# Patient Record
Sex: Female | Born: 1995 | Race: White | Hispanic: No | Marital: Married | Smoking: Never smoker
Health system: Southern US, Community
[De-identification: ages and names within clinical notes are randomized; demographics above are authoritative.]

## PROBLEM LIST (undated history)

## (undated) DIAGNOSIS — G43909 Migraine, unspecified, not intractable, without status migrainosus: Secondary | ICD-10-CM

## (undated) DIAGNOSIS — R51 Headache: Secondary | ICD-10-CM

## (undated) DIAGNOSIS — R519 Headache, unspecified: Secondary | ICD-10-CM

## (undated) DIAGNOSIS — N200 Calculus of kidney: Secondary | ICD-10-CM

## (undated) HISTORY — DX: Headache, unspecified: R51.9

## (undated) HISTORY — DX: Headache: R51

## (undated) HISTORY — DX: Calculus of kidney: N20.0

## (undated) HISTORY — DX: Migraine, unspecified, not intractable, without status migrainosus: G43.909

---
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
## 2014-08-03 HISTORY — PX: MOUTH SURGERY: SHX715

## 2014-10-27 ENCOUNTER — Encounter (HOSPITAL_COMMUNITY): Payer: Self-pay | Admitting: *Deleted

## 2014-10-27 ENCOUNTER — Emergency Department (HOSPITAL_COMMUNITY)
Admission: EM | Admit: 2014-10-27 | Discharge: 2014-10-27 | Disposition: A | Discharge status: Home / Self Care | Payer: BLUE CROSS/BLUE SHIELD | Source: Home / Self Care | Attending: Family Medicine | Admitting: Family Medicine

## 2014-10-27 DIAGNOSIS — J020 Streptococcal pharyngitis: Secondary | ICD-10-CM

## 2014-10-27 LAB — POCT RAPID STREP A: Streptococcus, Group A Screen (Direct): POSITIVE — AB

## 2014-10-27 MED ORDER — AMOXICILLIN 500 MG PO CAPS
500.0000 mg | ORAL_CAPSULE | Freq: Two times a day (BID) | ORAL | Status: DC
Start: 2014-10-27 — End: 2014-11-09

## 2014-10-27 NOTE — ED Notes (Signed)
C/O severe sore throat with difficulty swallowing anything due to pain.  Finished amoxicillin last month for sinusitis - has "been sick x7 wks".  C/O feeling feverish.  Denies n/v.  Also c/o earache & HA.  Has been taking IBU and gargling salt water.

## 2014-10-27 NOTE — Discharge Instructions (Signed)
Strep Throat °Strep throat is an infection of the throat caused by a bacteria named Streptococcus pyogenes. Your health care provider may call the infection streptococcal "tonsillitis" or "pharyngitis" depending on whether there are signs of inflammation in the tonsils or back of the throat. Strep throat is most common in children aged 20-15 years during the cold months of the year, but it can occur in people of any age during any season. This infection is spread from person to person (contagious) through coughing, sneezing, or other close contact. °SIGNS AND SYMPTOMS  °· Fever or chills. °· Painful, swollen, red tonsils or throat. °· Pain or difficulty when swallowing. °· White or yellow spots on the tonsils or throat. °· Swollen, tender lymph nodes or "glands" of the neck or under the jaw. °· Red rash all over the body (rare). °DIAGNOSIS  °Many different infections can cause the same symptoms. A test must be done to confirm the diagnosis so the right treatment can be given. A "rapid strep test" can help your health care provider make the diagnosis in a few minutes. If this test is not available, a light swab of the infected area can be used for a throat culture test. If a throat culture test is done, results are usually available in a day or two. °TREATMENT  °Strep throat is treated with antibiotic medicine. °HOME CARE INSTRUCTIONS  °· Gargle with 1 tsp of salt in 1 cup of warm water, 3-4 times per day or as needed for comfort. °· Family members who also have a sore throat or fever should be tested for strep throat and treated with antibiotics if they have the strep infection. °· Make sure everyone in your household washes their hands well. °· Do not share food, drinking cups, or personal items that could cause the infection to spread to others. °· You may need to eat a soft food diet until your sore throat gets better. °· Drink enough water and fluids to keep your urine clear or pale yellow. This will help prevent  dehydration. °· Get plenty of rest. °· Stay home from school, day care, or work until you have been on antibiotics for 24 hours. °· Take medicines only as directed by your health care provider. °· Take your antibiotic medicine as directed by your health care provider. Finish it even if you start to feel better. °SEEK MEDICAL CARE IF:  °· The glands in your neck continue to enlarge. °· You develop a rash, cough, or earache. °· You cough up green, yellow-brown, or bloody sputum. °· You have pain or discomfort not controlled by medicines. °· Your problems seem to be getting worse rather than better. °· You have a fever. °SEEK IMMEDIATE MEDICAL CARE IF:  °· You develop any new symptoms such as vomiting, severe headache, stiff or painful neck, chest pain, shortness of breath, or trouble swallowing. °· You develop severe throat pain, drooling, or changes in your voice. °· You develop swelling of the neck, or the skin on the neck becomes red and tender. °· You develop signs of dehydration, such as fatigue, dry mouth, and decreased urination. °· You become increasingly sleepy, or you cannot wake up completely. °MAKE SURE YOU: °· Understand these instructions. °· Will watch your condition. °· Will get help right away if you are not doing well or get worse. °Document Released: 07/17/2000 Document Revised: 12/04/2013 Document Reviewed: 09/18/2010 °Exit[employer]® Patient Information ©2015 [employer]. This information is not intended to replace advice given to you by   your health care provider. Make sure you discuss any questions you have with your health care provider.   Plenty of fluids and rest. Motrin 800mg  every 8 hours as needed for pain.

## 2014-10-27 NOTE — ED Provider Notes (Signed)
CSN: [id number]     Arrival date & time 10/27/14  0901 History   First MD Initiated Contact with Patient 10/27/14 [phone]     Chief Complaint  Patient presents with  . Sore Throat   (Consider location/radiation/quality/duration/timing/severity/associated sxs/prior Treatment) HPI Comments: Patient presents with a "severe" sore throat x 3 days.Noted malaise and fevers. No cough or congestion. As noted in Nurse note; given Amox last month for sinus infection. Works at a daycare with exposures.   Patient is a 19 y.o. female presenting with pharyngitis. The history is provided by the patient.  Sore Throat    History reviewed. No pertinent past medical history. History reviewed. No pertinent past surgical history. No family history on file. History  Substance Use Topics  . Smoking status: Never Smoker   . Smokeless tobacco: Not on file  . Alcohol Use: No   OB History    No data available     Review of Systems  Constitutional: Positive for chills and fatigue. Negative for fever.  HENT: Positive for sore throat and trouble swallowing. Negative for postnasal drip and rhinorrhea.   Respiratory: Negative.   All other systems reviewed and are negative.   Allergies  Review of patient's allergies indicates no known allergies.  Home Medications   Prior to Admission medications   Medication Sig Start Date End Date Taking? Authorizing Provider  Acetaminophen 500 MG coapsule Take 1,500 mg by mouth. Prn HA   Yes Historical Provider, MD  amoxicillin (AMOXIL) 500 MG capsule Take 1 capsule (500 mg total) by mouth 2 (two) times daily. 10/27/14   Riki SheerMichelle G Mindee Robledo, PA-C   BP 141/87 mmHg  Pulse 116  Temp(Src) 98.4 F (36.9 C) (Oral)  Resp 12  SpO2 100%  LMP 09/28/2014 (Exact Date) Physical Exam  Constitutional: She is oriented to person, place, and time. She appears well-developed and well-nourished. No distress.  HENT:  Head: Normocephalic and atraumatic.  Mouth/Throat: Oropharyngeal  exudate present.  Moderately injected oropharynx with mild ulceration, and exudate. Tonsils +2 on the left  Neck: Normal range of motion.  Lymphadenopathy:    She has cervical adenopathy.  Neurological: She is alert and oriented to person, place, and time.  Skin: Skin is warm and dry. No rash noted. She is not diaphoretic. No erythema.  Psychiatric: Her behavior is normal.  Nursing note and vitals reviewed.   ED Course  Procedures (including critical care time) Labs Review Labs Reviewed  POCT RAPID STREP A ([facility] ONLY) - Abnormal; Notable for the following:    Streptococcus, Group A Screen (Direct) POSITIVE (*)    All other components within normal limits    Imaging Review No results found.   MDM   1. Strep pharyngitis    Treat with 10 days of Amox. Motrin as needed.    Riki SheerMichelle G Danilo Cappiello, PA-C 10/27/14 [phone]

## 2014-11-09 ENCOUNTER — Emergency Department (INDEPENDENT_AMBULATORY_CARE_PROVIDER_SITE_OTHER)
Admission: EM | Admit: 2014-11-09 | Discharge: 2014-11-09 | Disposition: A | Discharge status: Home / Self Care | Payer: BLUE CROSS/BLUE SHIELD | Source: Home / Self Care | Attending: Family Medicine | Admitting: Family Medicine

## 2014-11-09 ENCOUNTER — Encounter (HOSPITAL_COMMUNITY): Payer: Self-pay | Admitting: Emergency Medicine

## 2014-11-09 DIAGNOSIS — J0300 Acute streptococcal tonsillitis, unspecified: Secondary | ICD-10-CM

## 2014-11-09 LAB — POCT RAPID STREP A: STREPTOCOCCUS, GROUP A SCREEN (DIRECT): POSITIVE — AB

## 2014-11-09 MED ORDER — AMOXICILLIN 500 MG PO CAPS
500.0000 mg | ORAL_CAPSULE | Freq: Two times a day (BID) | ORAL | Status: DC
Start: 2014-11-09 — End: 2014-12-15

## 2014-11-09 NOTE — ED Provider Notes (Signed)
CSN: [id number]     Arrival date & time 11/09/14  1040 History   First MD Initiated Contact with Patient 11/09/14 1156     Chief Complaint  Patient presents with  . Sore Throat   (Consider location/radiation/quality/duration/timing/severity/associated sxs/prior Treatment) HPI         19 year old female presents complaining of possible strep throat. She had strep throat about 2-1/2 weeks ago. She treated this with amoxicillin and it got better. Her symptoms returned yesterday with sore throat, fever, chills, and tonsillar swelling. He says that no one told her she was supposed to throw away her toothbrush so she thinks she may have reinfected herself. No rash, cough, NVD.  History reviewed. No pertinent past medical history. History reviewed. No pertinent past surgical history. No family history on file. History  Substance Use Topics  . Smoking status: Never Smoker   . Smokeless tobacco: Not on file  . Alcohol Use: No   OB History    No data available     Review of Systems  Constitutional: Positive for fever and chills.  HENT: Positive for sore throat.   Respiratory: Negative for cough.   All other systems reviewed and are negative.   Allergies  Review of patient's allergies indicates no known allergies.  Home Medications   Prior to Admission medications   Medication Sig Start Date End Date Taking? Authorizing Provider  Acetaminophen 500 MG coapsule Take 1,500 mg by mouth. Prn HA    Historical Provider, MD  amoxicillin (AMOXIL) 500 MG capsule Take 1 capsule (500 mg total) by mouth 2 (two) times daily. 11/09/14   Adrian BlackwaterZachary H Parlee Amescua, PA-C   BP 124/79 mmHg  Pulse 114  Temp(Src) 99.5 F (37.5 C) (Oral)  Resp 14  SpO2 99%  LMP 09/28/2014 (Exact Date) Physical Exam  Constitutional: She is oriented to person, place, and time. Vital signs are normal. She appears well-developed and well-nourished. No distress.  HENT:  Head: Normocephalic and atraumatic.  Nose: Nose normal.   Mouth/Throat: Oropharyngeal exudate and posterior oropharyngeal erythema present.  3+ tonsillar enlargement symmetric bilaterally  Neck: Normal range of motion. Neck supple.  Pulmonary/Chest: Effort normal. No respiratory distress.  Lymphadenopathy:    She has cervical adenopathy (tonsillar and posterior cervical ).  Neurological: She is alert and oriented to person, place, and time. She has normal strength. Coordination normal.  Skin: Skin is warm and dry. No rash noted. She is not diaphoretic.  Psychiatric: She has a normal mood and affect. Judgment normal.  Nursing note and vitals reviewed.   ED Course  Procedures (including critical care time) Labs Review Labs Reviewed  POCT RAPID STREP A ([facility] ONLY) - Abnormal; Notable for the following:    Streptococcus, Group A Screen (Direct) POSITIVE (*)    All other components within normal limits    Imaging Review No results found.   MDM   1. Strep tonsillitis    Rapid strep is positive. Treat with amoxicillin again. Discussed strategies to prevent reinfection   Meds ordered this encounter  Medications  . amoxicillin (AMOXIL) 500 MG capsule    Sig: Take 1 capsule (500 mg total) by mouth 2 (two) times daily.    Dispense:  14 capsule    Refill:  0       Graylon GoodZachary H Kanesha Cadle, PA-C 11/09/14 1231

## 2014-11-09 NOTE — ED Notes (Signed)
C/o persistent sore throat onset 1 month [place] e[place] on 03/26  And was given Amox 500mg  for strep Taking ibup and tyle w/no relief Alert, no signs of acute distress.

## 2014-11-09 NOTE — Discharge Instructions (Signed)
Strep Throat °Strep throat is an infection of the throat caused by a bacteria named Streptococcus pyogenes. Your health care provider may call the infection streptococcal "tonsillitis" or "pharyngitis" depending on whether there are signs of inflammation in the tonsils or back of the throat. Strep throat is most common in children aged 20-15 years during the cold months of the year, but it can occur in people of any age during any season. This infection is spread from person to person (contagious) through coughing, sneezing, or other close contact. °SIGNS AND SYMPTOMS  °· Fever or chills. °· Painful, swollen, red tonsils or throat. °· Pain or difficulty when swallowing. °· White or yellow spots on the tonsils or throat. °· Swollen, tender lymph nodes or "glands" of the neck or under the jaw. °· Red rash all over the body (rare). °DIAGNOSIS  °Many different infections can cause the same symptoms. A test must be done to confirm the diagnosis so the right treatment can be given. A "rapid strep test" can help your health care provider make the diagnosis in a few minutes. If this test is not available, a light swab of the infected area can be used for a throat culture test. If a throat culture test is done, results are usually available in a day or two. °TREATMENT  °Strep throat is treated with antibiotic medicine. °HOME CARE INSTRUCTIONS  °· Gargle with 1 tsp of salt in 1 cup of warm water, 3-4 times per day or as needed for comfort. °· Family members who also have a sore throat or fever should be tested for strep throat and treated with antibiotics if they have the strep infection. °· Make sure everyone in your household washes their hands well. °· Do not share food, drinking cups, or personal items that could cause the infection to spread to others. °· You may need to eat a soft food diet until your sore throat gets better. °· Drink enough water and fluids to keep your urine clear or pale yellow. This will help prevent  dehydration. °· Get plenty of rest. °· Stay home from school, day care, or work until you have been on antibiotics for 24 hours. °· Take medicines only as directed by your health care provider. °· Take your antibiotic medicine as directed by your health care provider. Finish it even if you start to feel better. °SEEK MEDICAL CARE IF:  °· The glands in your neck continue to enlarge. °· You develop a rash, cough, or earache. °· You cough up green, yellow-brown, or bloody sputum. °· You have pain or discomfort not controlled by medicines. °· Your problems seem to be getting worse rather than better. °· You have a fever. °SEEK IMMEDIATE MEDICAL CARE IF:  °· You develop any new symptoms such as vomiting, severe headache, stiff or painful neck, chest pain, shortness of breath, or trouble swallowing. °· You develop severe throat pain, drooling, or changes in your voice. °· You develop swelling of the neck, or the skin on the neck becomes red and tender. °· You develop signs of dehydration, such as fatigue, dry mouth, and decreased urination. °· You become increasingly sleepy, or you cannot wake up completely. °MAKE SURE YOU: °· Understand these instructions. °· Will watch your condition. °· Will get help right away if you are not doing well or get worse. °Document Released: 07/17/2000 Document Revised: 12/04/2013 Document Reviewed: 09/18/2010 °[employer]® Patient Information ©2015 [employer]. This information is not intended to replace advice given to you by   your health care provider. Make sure you discuss any questions you have with your health care provider. °Tonsillitis °Tonsillitis is an infection of the throat that causes the tonsils to become red, tender, and swollen. Tonsils are collections of lymphoid tissue at the back of the throat. Each tonsil has crevices (crypts). Tonsils help fight nose and throat infections and keep infection from spreading to other parts of the body for the first 18 months of life.    °CAUSES °Sudden (acute) tonsillitis is usually caused by infection with streptococcal bacteria. Long-lasting (chronic) tonsillitis occurs when the crypts of the tonsils become filled with pieces of food and bacteria, which makes it easy for the tonsils to become repeatedly infected. °SYMPTOMS  °Symptoms of tonsillitis include: °· A sore throat, with possible difficulty swallowing. °· White patches on the tonsils. °· Fever. °· Tiredness. °· New episodes of snoring during sleep, when you did not snore before. °· Small, foul-smelling, yellowish-white pieces of material (tonsilloliths) that you occasionally cough up or spit out. The tonsilloliths can also cause you to have bad breath. °DIAGNOSIS °Tonsillitis can be diagnosed through a physical exam. Diagnosis can be confirmed with the results of lab tests, including a throat culture. °TREATMENT  °The goals of tonsillitis treatment include the reduction of the severity and duration of symptoms and prevention of associated conditions. Symptoms of tonsillitis can be improved with the use of steroids to reduce the swelling. Tonsillitis caused by bacteria can be treated with antibiotic medicines. Usually, treatment with antibiotic medicines is started before the cause of the tonsillitis is known. However, if it is determined that the cause is not bacterial, antibiotic medicines will not treat the tonsillitis. If attacks of tonsillitis are severe and frequent, your health care provider may recommend surgery to remove the tonsils (tonsillectomy). °HOME CARE INSTRUCTIONS  °· Rest as much as possible and get plenty of sleep. °· Drink plenty of fluids. While the throat is very sore, eat soft foods or liquids, such as sherbet, soups, or instant breakfast drinks. °· Eat frozen ice pops. °· Gargle with a warm or cold liquid to help soothe the throat. Mix 1/4 teaspoon of salt and 1/4 teaspoon of baking soda in 8 oz of water. °SEEK MEDICAL CARE IF:  °· Large, tender lumps develop in  your neck. °· A rash develops. °· A green, yellow-brown, or bloody substance is coughed up. °· You are unable to swallow liquids or food for 24 hours. °· You notice that only one of the tonsils is swollen. °SEEK IMMEDIATE MEDICAL CARE IF:  °· You develop any new symptoms such as vomiting, severe headache, stiff neck, chest pain, or trouble breathing or swallowing. °· You have severe throat pain along with drooling or voice changes. °· You have severe pain, unrelieved with recommended medications. °· You are unable to fully open the mouth. °· You develop redness, swelling, or severe pain anywhere in the neck. °· You have a fever. °MAKE SURE YOU:  °· Understand these instructions. °· Will watch your condition. °· Will get help right away if you are not doing well or get worse. °Document Released: 04/29/2005 Document Revised: 12/04/2013 Document Reviewed: 01/06/2013 °[employer]® Patient Information ©2015 [employer]. This information is not intended to replace advice given to you by your health care provider. Make sure you discuss any questions you have with your health care provider. ° °

## 2014-12-15 ENCOUNTER — Emergency Department (HOSPITAL_COMMUNITY)
Admission: EM | Admit: 2014-12-15 | Discharge: 2014-12-15 | Disposition: A | Discharge status: Home / Self Care | Payer: BLUE CROSS/BLUE SHIELD | Source: Home / Self Care | Attending: Emergency Medicine | Admitting: Emergency Medicine

## 2014-12-15 DIAGNOSIS — J039 Acute tonsillitis, unspecified: Secondary | ICD-10-CM | POA: Diagnosis not present

## 2014-12-15 LAB — POCT RAPID STREP A: STREPTOCOCCUS, GROUP A SCREEN (DIRECT): NEGATIVE

## 2014-12-15 MED ORDER — AMOXICILLIN 500 MG PO CAPS: 500.0000 mg | ORAL_CAPSULE | Freq: Two times a day (BID) | ORAL | Status: DC

## 2014-12-15 NOTE — Discharge Instructions (Signed)
Your rapid strep test is negative, but it looks like you have a bacterial tonsillitis. Take amoxicillin twice a day for 10 days. [facility].

## 2014-12-15 NOTE — ED Provider Notes (Signed)
CSN: [id number]     Arrival date & time 12/15/14  [id number] History   First MD Initiated Contact with Patient 12/15/14 1008     Chief Complaint  Patient presents with  . Sore Throat   (Consider location/radiation/quality/duration/timing/severity/associated sxs/prior Treatment) HPI  She is a 19 year old woman here for evaluation of sore throat. Symptoms started 2 days ago with sore throat, white spots on tonsils, headache, subjective fever. She reports a very mild runny nose. No cough. She has been treated for strep throat twice in the last 6 weeks.  No past medical history on file. No past surgical history on file. No family history on file. History  Substance Use Topics  . Smoking status: Never Smoker   . Smokeless tobacco: Not on file  . Alcohol Use: No   OB History    No data available     Review of Systems As in history of present illness Allergies  Review of patient's allergies indicates no known allergies.  Home Medications   Prior to Admission medications   Medication Sig Start Date End Date Taking? Authorizing Provider  Acetaminophen 500 MG coapsule Take 1,500 mg by mouth. Prn HA    Historical Provider, MD  amoxicillin (AMOXIL) 500 MG capsule Take 1 capsule (500 mg total) by mouth 2 (two) times daily. 12/15/14   Charm RingsErin J Philo Kurtz, MD   BP 123/81 mmHg  Pulse 115  Temp(Src) 99.8 F (37.7 C) (Oral)  Resp 16  SpO2 99%  LMP 12/11/2014 (Exact Date) Physical Exam  Constitutional: She is oriented to person, place, and time. She appears well-developed and well-nourished. No distress.  HENT:  Nose: Nose normal.  Mouth/Throat: Oropharyngeal exudate present.  Tonsils are swollen and erythematous  Eyes: Conjunctivae are normal.  Neck: Neck supple.  Cardiovascular: Normal rate, regular rhythm and normal heart sounds.   No murmur heard. Pulmonary/Chest: Effort normal and breath sounds normal. No respiratory distress. She has no wheezes. She has no rales.  Lymphadenopathy:    She  has cervical adenopathy (submandibular).  Neurological: She is alert and oriented to person, place, and time.    ED Course  Procedures (including critical care time) Labs Review Labs Reviewed  POCT RAPID STREP A ([facility] ONLY)    Imaging Review No results found.   MDM   1. Tonsillitis    Rapid strep is negative. I'm concerned for other bacterial tonsillitis. We'll treat with amoxicillin for 10 days. Referral placed for ENT.    Charm RingsErin J Haeley Fordham, MD 12/15/14 [phone]

## 2014-12-15 NOTE — ED Notes (Signed)
Sore throat  X 2 days. Pt has been exposed to strep. Delice LeschAU,RMA

## 2014-12-18 LAB — CULTURE, GROUP A STREP

## 2014-12-19 ENCOUNTER — Telehealth (HOSPITAL_COMMUNITY): Payer: Self-pay | Admitting: *Deleted

## 2014-12-19 NOTE — ED Notes (Addendum)
Throat culture: Strep beta hemolytic not group A.  Pt. adequately treated with Amoxicillin. I called pt. and she said the nurse, came back to the room and told her she did not need the Rx. I told her I could see that [place] [facility] [place]. I apologized for the confusion.  She said she is better.  I told her this type of strep needs treated, if she is not getting better- per previous discussions with the doctor's. Since she is better, I told her she does not need the Rx. Pt. had no questions. Will let Dr. Piedad ClimesHonig know. Vassie MoselleYork, Jamaul Heist M 12/19/2014 Dr. Piedad ClimesHonig wrote back that she did intend for pt. to get the medication and it must have been a miscommunication.  No further action needed now since pt. said she is better. 12/20/2014

## 2015-06-04 ENCOUNTER — Emergency Department (HOSPITAL_COMMUNITY)
Admission: EM | Admit: 2015-06-04 | Discharge: 2015-06-04 | Disposition: A | Discharge status: Home / Self Care | Payer: BLUE CROSS/BLUE SHIELD | Source: Home / Self Care | Attending: Emergency Medicine | Admitting: Emergency Medicine

## 2015-06-04 ENCOUNTER — Encounter (HOSPITAL_COMMUNITY): Payer: Self-pay | Admitting: Emergency Medicine

## 2015-06-04 DIAGNOSIS — J9801 Acute bronchospasm: Secondary | ICD-10-CM

## 2015-06-04 MED ORDER — PREDNISONE 20 MG PO TABS
ORAL_TABLET | ORAL | Status: DC
Start: 2015-06-04 — End: 2015-06-11

## 2015-06-04 MED ORDER — IPRATROPIUM-ALBUTEROL 0.5-2.5 (3) MG/3ML IN SOLN
RESPIRATORY_TRACT | Status: AC
  Filled 2015-06-04: qty 3

## 2015-06-04 MED ORDER — ALBUTEROL SULFATE HFA 108 (90 BASE) MCG/ACT IN AERS: 2.0000 | INHALATION_SPRAY | RESPIRATORY_TRACT | Status: DC | PRN

## 2015-06-04 MED ORDER — IPRATROPIUM-ALBUTEROL 0.5-2.5 (3) MG/3ML IN SOLN
3.0000 mL | Freq: Once | RESPIRATORY_TRACT | Status: AC
  Administered 2015-06-04: 3 mL via RESPIRATORY_TRACT

## 2015-06-04 NOTE — ED Provider Notes (Signed)
CSN: [id number]     Arrival date & time 06/04/15  1426 History   First MD Initiated Contact with Patient 06/04/15 1452     Chief Complaint  Patient presents with  . Cough  . Wheezing   (Consider location/radiation/quality/duration/timing/severity/associated sxs/prior Treatment) HPI Comments: 19 year old female complaining of a cough and wheeze for 4-5 days. She has mild upper rest were congestion. She denies earache or sore throat except when she coughs. Denies fever. She does not have a history of asthma or smoking.  Patient is a 19 y.o. female presenting with cough and wheezing.  Cough Associated symptoms: sore throat and wheezing   Associated symptoms: no chills, no fever, no rash and no rhinorrhea   Wheezing Associated symptoms: cough and sore throat   Associated symptoms: no fatigue, no fever, no rash and no rhinorrhea     History reviewed. No pertinent past medical history. History reviewed. No pertinent past surgical history. History reviewed. No pertinent family history. Social History  Substance Use Topics  . Smoking status: Never Smoker   . Smokeless tobacco: None  . Alcohol Use: No   OB History    No data available     Review of Systems  Constitutional: Positive for activity change. Negative for fever, chills, appetite change and fatigue.  HENT: Positive for congestion and sore throat. Negative for facial swelling, postnasal drip, rhinorrhea and trouble swallowing.   Eyes: Negative.   Respiratory: Positive for cough and wheezing.   Cardiovascular: Negative.   Musculoskeletal: Negative for neck pain and neck stiffness.  Skin: Negative for pallor and rash.  Neurological: Negative.     Allergies  Review of patient's allergies indicates no known allergies.  Home Medications   Prior to Admission medications   Medication Sig Start Date End Date Taking? Authorizing Provider  Acetaminophen 500 MG coapsule Take 1,500 mg by mouth. Prn HA   Yes Historical Provider,  MD  albuterol (PROVENTIL HFA;VENTOLIN HFA) 108 (90 BASE) MCG/ACT inhaler Inhale 2 puffs into the lungs every 4 (four) hours as needed for wheezing or shortness of breath. 06/04/15   Hayden Rasmussen, NP  predniSONE (DELTASONE) 20 MG tablet Take 3 tabs po on first day, 2 tabs second day, 2 tabs third day, 1 tab fourth day, 1 tab 5th day. Take with food. 06/04/15   Hayden Rasmussen, NP   Meds Ordered and Administered this Visit   Medications  ipratropium-albuterol (DUONEB) 0.5-2.5 (3) MG/3ML nebulizer solution 3 mL (3 mLs Nebulization Given 06/04/15 1535)    BP 125/77 mmHg  Pulse 122  Temp(Src) 99.8 F (37.7 C) (Oral)  Resp 20  SpO2 98%  LMP 05/21/2015 (Exact Date) No data found.   Physical Exam  Constitutional: She is oriented to person, place, and time. She appears well-developed and well-nourished. No distress.  HENT:  Mouth/Throat: No oropharyngeal exudate.  Bilateral TMs are normal Oropharynx with mild erythema and much cobblestoning. Mild clear PND.  Eyes: Conjunctivae and EOM are normal.  Neck: Normal range of motion. Neck supple.  Cardiovascular: Normal rate, regular rhythm and normal heart sounds.   Pulmonary/Chest: Effort normal. No respiratory distress. She has wheezes. She has no rales.  Bilateral wheezing with forced expiration. Prolonged expiratory phase.  Musculoskeletal: Normal range of motion. She exhibits no edema.  Lymphadenopathy:    She has no cervical adenopathy.  Neurological: She is alert and oriented to person, place, and time.  Skin: Skin is warm and dry. No rash noted.  Psychiatric: She has a normal mood and affect.  Nursing note and vitals reviewed.   ED Course  Procedures (including critical care time)  Labs Review Labs Reviewed - No data to display  Imaging Review No results found.   Visual Acuity Review  Right Eye Distance:   Left Eye Distance:   Bilateral Distance:    Right Eye Near:   Left Eye Near:    Bilateral Near:         MDM   1.  Cough due to bronchospasm    Patient may also have a URI that precipitated her bronchospasm. Albuterol HFA 2 puffs every 4-6 hours as needed for cough and wheeze Prednisone taper dose Recommend taking antihistamines such as Allegra, Claritin or Zyrtec.     Hayden Rasmussenavid Lora Glomski, NP 06/04/15 [phone]

## 2015-06-04 NOTE — Discharge Instructions (Signed)
Bronchospasm, Adult Albuterol HFA 2 puffs every 4-6 hours as needed for cough and wheeze Prednisone taper dose Recommend taking antihistamines such as Allegra, Claritin or Zyrtec. A bronchospasm is a spasm or tightening of the airways going into the lungs. During a bronchospasm breathing becomes more difficult because the airways get smaller. When this happens there can be coughing, a whistling sound when breathing (wheezing), and difficulty breathing. Bronchospasm is often associated with asthma, but not all patients who experience a bronchospasm have asthma. CAUSES  A bronchospasm is caused by inflammation or irritation of the airways. The inflammation or irritation may be triggered by:   Allergies (such as to animals, pollen, food, or mold). Allergens that cause bronchospasm may cause wheezing immediately after exposure or many hours later.   Infection. Viral infections are believed to be the most common cause of bronchospasm.   Exercise.   Irritants (such as pollution, cigarette smoke, strong odors, aerosol sprays, and paint fumes).   Weather changes. Winds increase molds and pollens in the air. Rain refreshes the air by washing irritants out. Cold air may cause inflammation.   Stress and emotional upset.  SIGNS AND SYMPTOMS   Wheezing.   Excessive nighttime coughing.   Frequent or severe coughing with a simple cold.   Chest tightness.   Shortness of breath.  DIAGNOSIS  Bronchospasm is usually diagnosed through a history and physical exam. Tests, such as chest X-rays, are sometimes done to look for other conditions. TREATMENT   Inhaled medicines can be given to open up your airways and help you breathe. The medicines can be given using either an inhaler or a nebulizer machine.  Corticosteroid medicines may be given for severe bronchospasm, usually when it is associated with asthma. HOME CARE INSTRUCTIONS   Always have a plan prepared for seeking medical care.  Know when to call your health care provider and local emergency services (911 in the [place] Know where you can access local emergency care.  Only take medicines as directed by your health care provider.  If you were prescribed an inhaler or nebulizer machine, ask your health care provider to explain how to use it correctly. Always use a spacer with your inhaler if you were given one.  It is necessary to remain calm during an attack. Try to relax and breathe more slowly.  Control your home environment in the following ways:   Change your heating and air conditioning filter at least once a month.   Limit your use of fireplaces and wood stoves.  Do not smoke and do not allow smoking in your home.   Avoid exposure to perfumes and fragrances.   Get rid of pests (such as roaches and mice) and their droppings.   Throw away plants if you see mold on them.   Keep your house clean and dust free.   Replace carpet with wood, tile, or vinyl flooring. Carpet can trap dander and dust.   Use allergy-proof pillows, mattress covers, and box spring covers.   Wash bed sheets and blankets every week in hot water and dry them in a dryer.   Use blankets that are made of polyester or cotton.   Wash hands frequently. SEEK MEDICAL CARE IF:   You have muscle aches.   You have chest pain.   The sputum changes from clear or white to yellow, green, gray, or bloody.   The sputum you cough up gets thicker.   There are problems that may be related to the medicine you  are given, such as a rash, itching, swelling, or trouble breathing.  SEEK IMMEDIATE MEDICAL CARE IF:   You have worsening wheezing and coughing even after taking your prescribed medicines.   You have increased difficulty breathing.   You develop severe chest pain. MAKE SURE YOU:   Understand these instructions.  Will watch your condition.  Will get help right away if you are not doing well or get worse.     This information is not intended to replace advice given to you by your health care provider. Make sure you discuss any questions you have with your health care provider.   Document Released: 07/23/2003 Document Revised: 08/10/2014 Document Reviewed: 01/09/2013 Elsevier Interactive Patient Education [employer]

## 2015-06-04 NOTE — ED Notes (Signed)
Pt has been suffering from a cough and wheezing and some nasal congestion for 5 days.  She denies any fever, but states her throat hurts from all the coughing.  She has had a few doses of Robitussin, and some Tylenol and Ibuprofen with little relief.

## 2015-06-11 ENCOUNTER — Encounter: Payer: Self-pay | Admitting: Primary Care

## 2015-06-11 ENCOUNTER — Ambulatory Visit (INDEPENDENT_AMBULATORY_CARE_PROVIDER_SITE_OTHER): Payer: BLUE CROSS/BLUE SHIELD | Admitting: Primary Care

## 2015-06-11 VITALS — BP 118/64 | HR 96 | Temp 98.2°F | Ht 69.0 in | Wt 273.1 lb

## 2015-06-11 DIAGNOSIS — J209 Acute bronchitis, unspecified: Secondary | ICD-10-CM | POA: Diagnosis not present

## 2015-06-11 DIAGNOSIS — Z7189 Other specified counseling: Secondary | ICD-10-CM

## 2015-06-11 DIAGNOSIS — Z7689 Persons encountering health services in other specified circumstances: Secondary | ICD-10-CM

## 2015-06-11 NOTE — Progress Notes (Signed)
Pre visit review using our clinic review tool, if applicable. No additional management support is needed unless otherwise documented below in the visit note. 

## 2015-06-11 NOTE — Patient Instructions (Signed)
Start Zpak antibiotics. Take 2 tablets by mouth today, then 1 tablet by mouth daily for 4 additional days.  You may take the Benzonatate capsules three times daily as needed for cough.  Please call me if no improvement in 3-4 days.  It was a pleasure to meet you today! Please don't hesitate to call me with any questions. Welcome to [facility]!

## 2015-06-11 NOTE — Progress Notes (Signed)
Subjective:    Patient ID: Autumn Stanley, female    DOB: 1996-02-01, 19 y.o.   MRN: [id number]  HPI  Autumn Stanley is a 19 year old female who presents today to establish care and discuss the problems mentioned below. Will obtain old records.  1) Bronchospasm: Evaluated at Urgent Care 1 week ago for wheezing and cough. She was told she had bronchospasm and was provided with a nebulized breathing treatment, RX for prednisone and RX for albuterol inhaler. She continues to cough, feel fatigued. Her cough has been present for a total of 2 weeks. She's overall not feeling any improved.   2) Frequent Headaches: Chronic since middle school. She will get headaches 3-4 times weekly. She will take tylenol or ibuprofen with improvement. She's never been on preventative treatment for her headaches. Reports history of migraines.  Review of Systems  Constitutional: Positive for chills and fatigue. Negative for unexpected weight change.  HENT: Positive for congestion. Negative for rhinorrhea.   Respiratory: Positive for cough. Negative for shortness of breath and wheezing.   Cardiovascular: Negative for chest pain.  Gastrointestinal: Negative for constipation.  Genitourinary: Negative for difficulty urinating.  Musculoskeletal: Negative for myalgias and arthralgias.  Allergic/Immunologic: Positive for environmental allergies.  Neurological: Positive for headaches. Negative for dizziness and numbness.  Psychiatric/Behavioral:       Denies concerns for anxiety or depression       Past Medical History  Diagnosis Date  . Kidney stone   . Migraine     Social History   Social History  . Marital Status: Single    Spouse Name: N/A  . Number of Children: N/A  . Years of Education: N/A   Occupational History  . Not on file.   Social History Main Topics  . Smoking status: Never Smoker   . Smokeless tobacco: Not on file  . Alcohol Use: No  . Drug Use: No  . Sexual Activity: Not on file   Comment: occasionally sexually active   Other Topics Concern  . Not on file   Social History Narrative    Past Surgical History  Procedure Laterality Date  . Mouth surgery  2016    Family History  Problem Relation Age of Onset  . Arthritis Mother   . Breast cancer Mother   . Arthritis Maternal Grandmother   . Heart disease Maternal Grandmother   . Hyperlipidemia Maternal Grandmother   . Prostate cancer Maternal Grandfather   . Hyperlipidemia Maternal Grandfather   . Hyperlipidemia Paternal Grandmother   . Hyperlipidemia Paternal Grandfather     No Known Allergies  Current Outpatient Prescriptions on File Prior to Visit  Medication Sig Dispense Refill  . Acetaminophen 500 MG coapsule Take 1,500 mg by mouth. Prn HA    . albuterol (PROVENTIL HFA;VENTOLIN HFA) 108 (90 BASE) MCG/ACT inhaler Inhale 2 puffs into the lungs every 4 (four) hours as needed for wheezing or shortness of breath. (Patient not taking: Reported on 06/11/2015) 1 Inhaler 0   No current facility-administered medications on file prior to visit.    BP 118/64 mmHg  Pulse 96  Temp(Src) 98.2 F (36.8 C) (Oral)  Ht  (1.753 m)  Wt 273 lb 1.9 oz (123.886 kg)  BMI 40.31 kg/m2  SpO2 92%  LMP 05/21/2015 (Exact Date)    Objective:   Physical Exam  Constitutional: She is oriented to person, place, and time. She appears well-nourished.  HENT:  Right Ear: Tympanic membrane and ear canal normal.  Left Ear: Tympanic  membrane and ear canal normal.  Nose: Right sinus exhibits no maxillary sinus tenderness and no frontal sinus tenderness. Left sinus exhibits no maxillary sinus tenderness and no frontal sinus tenderness.  Mouth/Throat: Oropharynx is clear and moist.  Eyes: Conjunctivae are normal. Pupils are equal, round, and reactive to light.  Neck: Neck supple.  Cardiovascular: Normal rate and regular rhythm.   Pulmonary/Chest: Effort normal. She has rhonchi in the right upper field and the left upper field.   Neurological: She is alert and oriented to person, place, and time.  Skin: Skin is warm and dry.  Psychiatric: She has a normal mood and affect.          Assessment & Plan:  Bronchitis:  Treated at UC 1 week ago, provided with prednisone taper and albuterol. She continues to cough, feels fatigued and ill. No wheezing. Lungs with rhonchi to bilateral upper lobes, appears ill. Treat with Zpak antibiotics and Tessalon Pearls. Fluids, rest. Return precautions provided.

## 2015-06-12 ENCOUNTER — Telehealth: Payer: Self-pay | Admitting: *Deleted

## 2015-06-12 NOTE — Telephone Encounter (Signed)
Already sent the faxed this morning with the correction. Also just called the pharmacist to confirm.

## 2015-06-12 NOTE — Telephone Encounter (Signed)
Please apologize for me, our computers went down. The RX should be for Tessalon Pearls 200 mg capsules. 1 capsule by mouth three times daily as needed for cough. #21, no refills

## 2015-06-12 NOTE — Telephone Encounter (Signed)
[facility] at [facility]ic[facility] stating that they received a script for [employer] and they need to know if it should be for 100 mg or 200 mg. Please call her back to verify this.

## 2015-06-14 ENCOUNTER — Telehealth: Payer: Self-pay

## 2015-06-14 MED ORDER — MOXIFLOXACIN HCL 400 MG PO TABS: 400.0000 mg | ORAL_TABLET | Freq: Every day | ORAL | Status: DC

## 2015-06-14 NOTE — Telephone Encounter (Signed)
Pt left v/m; pt was seen 06/11/15 to establish care and given abx for cough; pt said she will finish abx today and pt is no better. Pt request different abx to [facility]. Pt request cb. Jae DireKate out of office and sending note to Dr Ermalene SearingBedsole.

## 2015-06-14 NOTE — Telephone Encounter (Signed)
Given rhonchi on lung exam and ill appearance at last OV. Will  Broaden antibiotics to avelox. If not improving by Monday need follow up appt for further eval. Go to ER for shortness of breath.

## 2015-06-14 NOTE — Telephone Encounter (Signed)
Sally-Ann notified as instructed by telephone.  

## 2015-07-19 ENCOUNTER — Ambulatory Visit (INDEPENDENT_AMBULATORY_CARE_PROVIDER_SITE_OTHER): Payer: BLUE CROSS/BLUE SHIELD | Admitting: Primary Care

## 2015-07-19 ENCOUNTER — Encounter: Payer: Self-pay | Admitting: Primary Care

## 2015-07-19 VITALS — BP 116/82 | HR 105 | Temp 97.8°F | Ht 69.0 in | Wt 273.0 lb

## 2015-07-19 DIAGNOSIS — Z30011 Encounter for initial prescription of contraceptive pills: Secondary | ICD-10-CM | POA: Diagnosis not present

## 2015-07-19 DIAGNOSIS — R519 Headache, unspecified: Secondary | ICD-10-CM

## 2015-07-19 DIAGNOSIS — R51 Headache: Secondary | ICD-10-CM

## 2015-07-19 LAB — POCT URINE PREGNANCY: PREG TEST UR: NEGATIVE

## 2015-07-19 MED ORDER — NORGESTIMATE-ETH ESTRADIOL 0.25-35 MG-MCG PO TABS: 1.0000 | ORAL_TABLET | Freq: Every day | ORAL | Status: DC

## 2015-07-19 MED ORDER — TOPIRAMATE 25 MG PO TABS: 25.0000 mg | ORAL_TABLET | Freq: Every day | ORAL | Status: DC

## 2015-07-19 NOTE — Progress Notes (Signed)
Pre visit review using our clinic review tool, if applicable. No additional management support is needed unless otherwise documented below in the visit note. 

## 2015-07-19 NOTE — Assessment & Plan Note (Signed)
History of since middle school, never on preventative treatment. Headaches 5-6 times weekly with migraines 2-3 times monthly. No improvement with OTC's. Exam unremarkable. Start low doseTopamax HS for prevention. Also starting OCP's that may help as well. Follow up in 6 weeks for re-evaluation.

## 2015-07-19 NOTE — Progress Notes (Signed)
Subjective:    Patient ID: Autumn Stanley, female    DOB: [birth year], 19 y.o.   MRN: [id number]  HPI  [name] is a 19 year old female who presents today to discuss multiple issues.  1) Headaches: She was evaluated as a new patient several months ago and mentioned frequent headaches. Her headaches have been present since Middle school. She was once getting headaches 3-4 times weekly, but over the last 3 weeks they increased to 5-6 times weekly. She will wake up with her headaches which will not dissipate for several days. She will get migraines 2-3 times month with photophobia and nausea. She takes tylenol, ibuprofen, Excedrin migraine without improvement.   2) Contraception: She is interested in starting birth control pills. She's never been managed on contraceptives in the past. Her LMP was November 24th. She is sexually active. Denies vaginal and urinary symptoms.  Review of Systems  Eyes: Positive for photophobia.  Cardiovascular: Negative for chest pain and palpitations.  Gastrointestinal: Positive for nausea.  Genitourinary: Negative for dysuria and vaginal discharge.  Neurological: Positive for headaches. Negative for dizziness.       Past Medical History  Diagnosis Date  . Kidney stone   . Migraine   . Frequent headaches     Social History   Social History  . Marital Status: Single    Spouse Name: N/A  . Number of Children: N/A  . Years of Education: N/A   Occupational History  . daycare worker    Social History Main Topics  . Smoking status: Never Smoker   . Smokeless tobacco: Not on file  . Alcohol Use: No  . Drug Use: No  . Sexual Activity: Not on file     Comment: occasionally sexually active   Other Topics Concern  . Not on file   Social History Narrative    Past Surgical History  Procedure Laterality Date  . Mouth surgery  2016    Family History  Problem Relation Age of Onset  . Arthritis Mother   . Breast cancer Mother   . Arthritis  Maternal Grandmother   . Heart disease Maternal Grandmother   . Hyperlipidemia Maternal Grandmother   . Prostate cancer Maternal Grandfather   . Hyperlipidemia Maternal Grandfather   . Hyperlipidemia Paternal Grandmother   . Hyperlipidemia Paternal Grandfather     No Known Allergies  Current Outpatient Prescriptions on File Prior to Visit  Medication Sig Dispense Refill  . Acetaminophen 500 MG coapsule Take 1,500 mg by mouth. Prn HA    . albuterol (PROVENTIL HFA;VENTOLIN HFA) 108 (90 BASE) MCG/ACT inhaler Inhale 2 puffs into the lungs every 4 (four) hours as needed for wheezing or shortness of breath. (Patient not taking: Reported on 06/11/2015) 1 Inhaler 0   No current facility-administered medications on file prior to visit.    BP 116/82 mmHg  Pulse 105  Temp(Src) 97.8 F (36.6 C) (Oral)  Ht 5\' 9"  (1.753 m)  Wt 273 lb (123.832 kg)  BMI 40.30 kg/m2  SpO2 97%  LMP 06/27/2015    Objective:   Physical Exam  Constitutional: She appears well-nourished.  Eyes: EOM are normal. Pupils are equal, round, and reactive to light.  Cardiovascular: Normal rate and regular rhythm.   Pulmonary/Chest: Effort normal and breath sounds normal.  Neurological: No cranial nerve deficit.  Skin: Skin is warm and dry.          Assessment & Plan:  Encounter for Birth Control:  Interested in birth control pills.  Never on any methods prior. LMP 06/27/15.  Urine pregnancy negative today. Sprintec sent to pharmacy with instructions on when to start first pill.

## 2015-07-19 NOTE — Patient Instructions (Signed)
Start Sprintec for birth control. Start taking the first pill on the first Sunday after you start menstruating. If you start menstruating on Sunday, then take the first pill then.  Start Topamax 25 mg for headaches. Take 1 tablet by mouth at bedtime.  Follow up in 6 weeks for re-evaluation of headaches.  It was a pleasure to see you today!

## 2015-08-30 ENCOUNTER — Ambulatory Visit: Payer: BLUE CROSS/BLUE SHIELD | Admitting: Primary Care

## 2015-09-04 ENCOUNTER — Ambulatory Visit (INDEPENDENT_AMBULATORY_CARE_PROVIDER_SITE_OTHER): Payer: BLUE CROSS/BLUE SHIELD | Admitting: Primary Care

## 2015-09-04 VITALS — BP 116/70 | HR 85 | Temp 98.2°F | Ht 69.0 in | Wt 260.1 lb

## 2015-09-04 DIAGNOSIS — N926 Irregular menstruation, unspecified: Secondary | ICD-10-CM

## 2015-09-04 DIAGNOSIS — R51 Headache: Secondary | ICD-10-CM | POA: Diagnosis not present

## 2015-09-04 DIAGNOSIS — R519 Headache, unspecified: Secondary | ICD-10-CM

## 2015-09-04 LAB — POCT URINE PREGNANCY: PREG TEST UR: NEGATIVE

## 2015-09-04 NOTE — Progress Notes (Signed)
Subjective:    Patient ID: Autumn Stanley, female    DOB: Sep 16, 1995, 20 y.o.   MRN: [id number]  HPI  Ms. Autumn Stanley is a 20 year old female who presents today for follow up of headaches. She was evaluated on 07/19/15 and placed on preventative treatment with Topamax 25 mg and also oral contraceptive pills. She has a long standing history of chronic headaches since middle school with headaches 5-6 times weekly and migraines 2-3 times monthly.  Since her last visit she's experienced a moderate reduction in headaches. She's getting 1-2 headaches weekly and has had 1 migraine in 6 weeks. She has not had to take any medication for the few headaches she's experienced since her last visit.  2) Missed period: Her first period appeared at age 72 after she lost a moderate amount of weight. She has a history of irregular periods but has been regular for the past year. Her LMP was on Thanksgiving 2016. Denies fatigue, abdominal cramping, back pain, bleeding. She's taken 1 pregnancy test at home about 2 weeks ago which was negative.    Review of Systems  Genitourinary: Negative for vaginal bleeding and pelvic pain.  Musculoskeletal: Negative for back pain.  Neurological:       Headaches are 1-2 times weekly.       Past Medical History  Diagnosis Date  . Kidney stone   . Migraine   . Frequent headaches     Social History   Social History  . Marital Status: Single    Spouse Name: N/A  . Number of Children: N/A  . Years of Education: N/A   Occupational History  . daycare worker    Social History Main Topics  . Smoking status: Never Smoker   . Smokeless tobacco: Not on file  . Alcohol Use: No  . Drug Use: No  . Sexual Activity: Not on file     Comment: occasionally sexually active   Other Topics Concern  . Not on file   Social History Narrative    Past Surgical History  Procedure Laterality Date  . Mouth surgery  2016    Family History  Problem Relation Age of Onset  .  Arthritis Mother   . Breast cancer Mother   . Arthritis Maternal Grandmother   . Heart disease Maternal Grandmother   . Hyperlipidemia Maternal Grandmother   . Prostate cancer Maternal Grandfather   . Hyperlipidemia Maternal Grandfather   . Hyperlipidemia Paternal Grandmother   . Hyperlipidemia Paternal Grandfather     No Known Allergies  Current Outpatient Prescriptions on File Prior to Visit  Medication Sig Dispense Refill  . topiramate (TOPAMAX) 25 MG tablet Take 1 tablet (25 mg total) by mouth at bedtime. 30 tablet 3  . norgestimate-ethinyl estradiol (ORTHO-CYCLEN,SPRINTEC,PREVIFEM) 0.25-35 MG-MCG tablet Take 1 tablet by mouth daily. (Patient not taking: Reported on 09/04/2015) 3 Package 3   No current facility-administered medications on file prior to visit.    BP 116/70 mmHg  Pulse 85  Temp(Src) 98.2 F (36.8 C) (Oral)  Ht  (1.753 m)  Wt 260 lb 1.9 oz (117.99 kg)  BMI 38.40 kg/m2  SpO2 98%    Objective:   Physical Exam  Constitutional: She appears well-nourished.  Eyes: EOM are normal. Pupils are equal, round, and reactive to light.  Cardiovascular: Normal rate and regular rhythm.   Pulmonary/Chest: Effort normal and breath sounds normal.  Abdominal: Soft.  Neurological: No cranial nerve deficit.  Skin: Skin is warm and dry.  Assessment & Plan:  Missed Period:  LMP was Thanksgiving 2016. No symptoms of abdominal cramping, bleeding, back pain, nausea. Negative pregnancy test at home. Urine pregnancy test in office today: Negative Will have her complete HCG quantitative testing for confirmation. If negative, then will have her start OCP's.

## 2015-09-04 NOTE — Progress Notes (Signed)
Pre visit review using our clinic review tool, if applicable. No additional management support is needed unless otherwise documented below in the visit note. 

## 2015-09-04 NOTE — Patient Instructions (Signed)
Complete lab work prior to leaving today. I will notify you of your results once received.   Continue Topamax at bedtime for headache prevention. Please E-mail me through my chart if your headaches become more frequent.  Follow up in 6 months for re-evaluation.  It was a pleasure to see you today!

## 2015-09-04 NOTE — Assessment & Plan Note (Signed)
Improved since initiation of topamax 25 mg HS. 1-2 headaches weekly now which are more tolerable. Will continue dose as is. Will have her follow up if headaches become bothersome.  Will have her start birth control tablets to aid with headaches.

## 2015-09-04 NOTE — Addendum Note (Signed)
Addended by: Tawnya Crook on: 09/04/2015 06:33 PM   Modules accepted: Orders

## 2015-09-05 ENCOUNTER — Other Ambulatory Visit: Payer: Self-pay | Admitting: Primary Care

## 2015-09-05 ENCOUNTER — Telehealth: Payer: Self-pay | Admitting: Primary Care

## 2015-09-05 DIAGNOSIS — N926 Irregular menstruation, unspecified: Secondary | ICD-10-CM

## 2015-09-05 LAB — HCG, QUANTITATIVE, PREGNANCY: QUANTITATIVE HCG: 0.65 m[IU]/mL

## 2015-09-05 NOTE — Telephone Encounter (Signed)
She can try taking these things over the counter for her symptoms:  Cough: Robitussin or Delsym Cough/Congestion: Try taking Mucinex DM. This will help loosen up the mucous in your chest. Ensure you take this medication with a full glass of water. Nasal Congestion: Try using Flonase (fluticasone) nasal spray. Instill 2 sprays in each nostril once daily.  Body aches/fevers: Tylenol or ibuprofen Throat drainage, sneezing, sinus drainage: Antihistamine such as Claritin or Zyrtec  I'm sorry she's not feeling well, please have her try these things.

## 2015-09-05 NOTE — Telephone Encounter (Signed)
Pt called stating she forgot to tell you yesterday  Pt stated she is sick all the time coughing/wheezing. Coughing up green mucus She wanted to know if you could call her in something since she was in yesterday  [facility] [address] in [place]

## 2015-09-05 NOTE — Telephone Encounter (Signed)
I'm sorry she's not feeling well, but she made no mention of this yesterday and I did not get the impression that she was acutely ill based off of my simple exam. I will need to see her again in the office to do a full examination.

## 2015-09-05 NOTE — Telephone Encounter (Signed)
Called and spoken to patient. Patient stated she is sorry that she did not mention it yesterday. Patient wanted to know what can she do beside coming in because she could not afford to pay another co-pay.

## 2015-09-05 NOTE — Telephone Encounter (Signed)
Pt called, CMA at lunch, read PCP's recommendations to pt, she expressed understanding and had not further questions.  Pt stated she did not need clinical call back / lt

## 2015-09-09 ENCOUNTER — Ambulatory Visit (INDEPENDENT_AMBULATORY_CARE_PROVIDER_SITE_OTHER): Payer: BLUE CROSS/BLUE SHIELD | Admitting: Primary Care

## 2015-09-09 ENCOUNTER — Other Ambulatory Visit (INDEPENDENT_AMBULATORY_CARE_PROVIDER_SITE_OTHER): Payer: BLUE CROSS/BLUE SHIELD

## 2015-09-09 ENCOUNTER — Encounter: Payer: Self-pay | Admitting: Primary Care

## 2015-09-09 VITALS — BP 116/70 | HR 76 | Temp 98.0°F | Ht 69.0 in | Wt 260.0 lb

## 2015-09-09 DIAGNOSIS — R05 Cough: Secondary | ICD-10-CM

## 2015-09-09 DIAGNOSIS — R059 Cough, unspecified: Secondary | ICD-10-CM

## 2015-09-09 DIAGNOSIS — N926 Irregular menstruation, unspecified: Secondary | ICD-10-CM | POA: Diagnosis not present

## 2015-09-09 LAB — HCG, QUANTITATIVE, PREGNANCY: QUANTITATIVE HCG: 0.37 m[IU]/mL

## 2015-09-09 MED ORDER — AZITHROMYCIN 250 MG PO TABS: ORAL_TABLET | ORAL | Status: DC

## 2015-09-09 MED ORDER — HYDROCODONE-HOMATROPINE 5-1.5 MG/5ML PO SYRP: 5.0000 mL | ORAL_SOLUTION | Freq: Every evening | ORAL | Status: DC | PRN

## 2015-09-09 NOTE — Patient Instructions (Signed)
Start Azithromycin antibiotics. Take 2 tablets by mouth today, then 1 tablet daily for 4 additional days.  You may take the Hycodan cough suppressant at bedtime as needed for cough and rest. Caution this medication contains codeine and will make you feel drowsy.  Use your albuterol inhaler every 6 hours as needed for wheezing/shortness of breath.  Try Robitussin cough syrup or Dayquil as needed for day cough.  Increase consumption of fluids and rest.  It was a pleasure to see you today!

## 2015-09-09 NOTE — Progress Notes (Signed)
Pre visit review using our clinic review tool, if applicable. No additional management support is needed unless otherwise documented below in the visit note. 

## 2015-09-09 NOTE — Progress Notes (Signed)
Subjective:    Patient ID: Autumn Stanley, female    DOB: Aug 17, 1995, 20 y.o.   MRN: [id number]  HPI  Ms. Autumn Stanley is a 20 year old female who presents today with a chief complaint of cough. She also reports wheezing, sore throat, nasal congestion, chest congestion. Her symptoms have been present for roughly 1 week. Thursday night last week her symptoms became worse. Her cough is productive with green mucous and has noticed wheezing deep into her chest wall. She's taken Mucinex and Delsym without improvement. Denies fevers, nausea, vomiting. Her most bothersome symptom is cough.  Review of Systems  Constitutional: Positive for fatigue. Negative for fever and chills.  HENT: Positive for congestion, sinus pressure and sore throat. Negative for ear pain.   Respiratory: Positive for cough and wheezing.   Cardiovascular: Negative for chest pain.  Gastrointestinal: Negative for nausea.  Musculoskeletal: Negative for myalgias.       Past Medical History  Diagnosis Date  . Kidney stone   . Migraine   . Frequent headaches     Social History   Social History  . Marital Status: Single    Spouse Name: N/A  . Number of Children: N/A  . Years of Education: N/A   Occupational History  . daycare worker    Social History Main Topics  . Smoking status: Never Smoker   . Smokeless tobacco: Not on file  . Alcohol Use: No  . Drug Use: No  . Sexual Activity: Not on file     Comment: occasionally sexually active   Other Topics Concern  . Not on file   Social History Narrative    Past Surgical History  Procedure Laterality Date  . Mouth surgery  2016    Family History  Problem Relation Age of Onset  . Arthritis Mother   . Breast cancer Mother   . Arthritis Maternal Grandmother   . Heart disease Maternal Grandmother   . Hyperlipidemia Maternal Grandmother   . Prostate cancer Maternal Grandfather   . Hyperlipidemia Maternal Grandfather   . Hyperlipidemia Paternal Grandmother   .  Hyperlipidemia Paternal Grandfather     No Known Allergies  Current Outpatient Prescriptions on File Prior to Visit  Medication Sig Dispense Refill  . topiramate (TOPAMAX) 25 MG tablet Take 1 tablet (25 mg total) by mouth at bedtime. 30 tablet 3  . norgestimate-ethinyl estradiol (ORTHO-CYCLEN,SPRINTEC,PREVIFEM) 0.25-35 MG-MCG tablet Take 1 tablet by mouth daily. (Patient not taking: Reported on 09/09/2015) 3 Package 3   No current facility-administered medications on file prior to visit.    BP 116/70 mmHg  Pulse 76  Temp(Src) 98 F (36.7 C) (Oral)  Ht  (1.753 m)  Wt 260 lb (117.935 kg)  BMI 38.38 kg/m2  SpO2 97%    Objective:   Physical Exam  Constitutional: She appears well-nourished.  HENT:  Right Ear: Tympanic membrane and ear canal normal.  Left Ear: Tympanic membrane and ear canal normal.  Nose: Right sinus exhibits maxillary sinus tenderness. Right sinus exhibits no frontal sinus tenderness. Left sinus exhibits maxillary sinus tenderness. Left sinus exhibits no frontal sinus tenderness.  Mouth/Throat: Posterior oropharyngeal erythema present. No oropharyngeal exudate or posterior oropharyngeal edema.  Neck: Neck supple.  Cardiovascular: Normal rate and regular rhythm.   Pulmonary/Chest: She has no decreased breath sounds. She has wheezes in the right upper field, the right lower field, the left upper field and the left lower field. She has rhonchi in the right upper field, the right lower field,  the left upper field and the left lower field.  Lymphadenopathy:    She has no cervical adenopathy.  Skin: Skin is warm and dry.          Assessment & Plan:  URI:  Cough, fatigue, nasal congestion x 1 week. Now productive with green sputum and wheezing. Temporary improvement with OTC's. Exam with mild wheezing and rhonchi throughout lung fields. Due to duration and examination will treat for presumed bacterial involvement. RX for Zpak sent to pharmacy. Hycodan  printed for HS cough. Robitussin for day cough, albuterol PRN. Increase fluids. Work note provided for today. Return precautions provided.

## 2015-09-11 ENCOUNTER — Other Ambulatory Visit: Payer: BLUE CROSS/BLUE SHIELD

## 2015-10-08 ENCOUNTER — Encounter: Payer: Self-pay | Admitting: Primary Care

## 2015-10-08 ENCOUNTER — Ambulatory Visit (INDEPENDENT_AMBULATORY_CARE_PROVIDER_SITE_OTHER): Payer: BLUE CROSS/BLUE SHIELD | Admitting: Primary Care

## 2015-10-08 VITALS — BP 118/76 | HR 89 | Temp 98.1°F | Ht 69.0 in | Wt 261.8 lb

## 2015-10-08 DIAGNOSIS — J029 Acute pharyngitis, unspecified: Secondary | ICD-10-CM

## 2015-10-08 DIAGNOSIS — R51 Headache: Secondary | ICD-10-CM

## 2015-10-08 DIAGNOSIS — R519 Headache, unspecified: Secondary | ICD-10-CM

## 2015-10-08 LAB — POCT RAPID STREP A (OFFICE): Rapid Strep A Screen: NEGATIVE

## 2015-10-08 MED ORDER — TOPIRAMATE 50 MG PO TABS: 50.0000 mg | ORAL_TABLET | Freq: Every day | ORAL | Status: DC

## 2015-10-08 NOTE — Patient Instructions (Addendum)
Your strep test was negative, which suggests that your infection is related to a virus.  Nasal Congestion: Try using Flonase (fluticasone) nasal spray. Instill 2 sprays in each nostril once daily.   Sore throat: Ibuprofen 600 mg three times daily as needed for pain and inflammation. Warm salt gargles three times daily. Chloraseptic spray.  Cough: Robitussin or Delsym.  Please notify me if you develop persistent fevers of 101, start coughing up green mucous, or feel worse after 1 week of onset of symptoms.   Increase consumption of water intake and rest.  It was a pleasure to see you today!

## 2015-10-08 NOTE — Progress Notes (Signed)
Pre visit review using our clinic review tool, if applicable. No additional management support is needed unless otherwise documented below in the visit note. 

## 2015-10-08 NOTE — Progress Notes (Signed)
Subjective:    Patient ID: Autumn Stanley, female    DOB: 06-25-1996, 20 y.o.   MRN: [id number]  HPI  Ms. Autumn Stanley is a 20 year old female who presents today with a chief complaint of sore throat. She also reports cough, nasal congestion, and headache. Her symptoms began 5 days ago. She has a history of recurrent strep (4-5 times in 2016) and was told she needed to have her tonsils removed. She's taken sudafed without significant improvement. 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 Her symptoms today feel like strep infections she's had in the past.   2) Frequent headaches: Currently managed on topamax 25 mg since late 2016. Overall improvement but has noticed headaches are starting to reoccur.   Review of Systems  Constitutional: Positive for chills and fatigue. Negative for fever.  HENT: Positive for congestion and sore throat. Negative for ear pain and sinus pressure.   Respiratory: Positive for cough. Negative for shortness of breath.   Cardiovascular: Negative for chest pain.  Musculoskeletal: Negative for myalgias.       Past Medical History  Diagnosis Date  . Kidney stone   . Migraine   . Frequent headaches     Social History   Social History  . Marital Status: Single    Spouse Name: N/A  . Number of Children: N/A  . Years of Education: N/A   Occupational History  . daycare worker    Social History Main Topics  . Smoking status: Never Smoker   . Smokeless tobacco: Not on file  . Alcohol Use: No  . Drug Use: No  . Sexual Activity: Not on file     Comment: occasionally sexually active   Other Topics Concern  . Not on file   Social History Narrative    Past Surgical History  Procedure Laterality Date  . Mouth surgery  2016    Family History  Problem Relation Age of Onset  . Arthritis Mother   . Breast cancer Mother   . Arthritis Maternal Grandmother   . Heart disease Maternal Grandmother   . Hyperlipidemia Maternal  Grandmother   . Prostate cancer Maternal Grandfather   . Hyperlipidemia Maternal Grandfather   . Hyperlipidemia Paternal Grandmother   . Hyperlipidemia Paternal Grandfather     No Known Allergies  Current Outpatient Prescriptions on File Prior to Visit  Medication Sig Dispense Refill  . norgestimate-ethinyl estradiol (ORTHO-CYCLEN,SPRINTEC,PREVIFEM) 0.25-35 MG-MCG tablet Take 1 tablet by mouth daily. 3 Package 3  . topiramate (TOPAMAX) 25 MG tablet Take 1 tablet (25 mg total) by mouth at bedtime. 30 tablet 3   No current facility-administered medications on file prior to visit.    BP 118/76 mmHg  Pulse 89  Temp(Src) 98.1 F (36.7 C) (Oral)  Ht  (1.753 m)  Wt 261 lb 12.8 oz (118.752 kg)  BMI 38.64 kg/m2  SpO2 98%  LMP 09/22/2015    Objective:   Physical Exam  Constitutional: She appears well-nourished.  HENT:  Right Ear: Tympanic membrane and ear canal normal.  Left Ear: Tympanic membrane and ear canal normal.  Nose: Right sinus exhibits no maxillary sinus tenderness and no frontal sinus tenderness. Left sinus exhibits no maxillary sinus tenderness and no frontal sinus tenderness.  Mouth/Throat: Posterior oropharyngeal edema and posterior oropharyngeal erythema present. No oropharyngeal exudate.  Tonsils grade 3  Eyes: Conjunctivae are normal.  Neck: Neck supple.  Cardiovascular: Normal rate and regular rhythm.   Pulmonary/Chest: Effort  normal and breath sounds normal. She has no wheezes. She has no rales.  Lymphadenopathy:    She has no cervical adenopathy.  Skin: Skin is warm and dry.          Assessment & Plan:  Sore throat:  Also with cough and nasal congestion since Thursday last week. History of recurrent strep in 2016. No sick contacts. Exam with enlarged tonsils which appear to be baseline. No exudate. Some erythema. Rapid Strep: Negative Suspect viral involvement and will treat with supportive measures. Ibuprofen, chloraseptic spray, flonase,  Robitussin. Return precautions provided.

## 2015-10-08 NOTE — Assessment & Plan Note (Signed)
Increased to 50 mg HS. She is to notify us in 4 weeks with an update on the new dose.

## 2015-10-09 NOTE — Addendum Note (Signed)
Addended by: Tawnya CrookSAMBATH, Tamana Hatfield on: 10/09/2015 10:47 AM   Modules accepted: Orders, SmartSet

## 2015-10-15 ENCOUNTER — Telehealth: Payer: Self-pay | Admitting: Primary Care

## 2015-10-15 ENCOUNTER — Telehealth: Payer: Self-pay

## 2015-10-15 DIAGNOSIS — J0301 Acute recurrent streptococcal tonsillitis: Secondary | ICD-10-CM

## 2015-10-15 DIAGNOSIS — R05 Cough: Secondary | ICD-10-CM

## 2015-10-15 DIAGNOSIS — R059 Cough, unspecified: Secondary | ICD-10-CM

## 2015-10-15 MED ORDER — AZITHROMYCIN 250 MG PO TABS: ORAL_TABLET | ORAL | Status: DC

## 2015-10-15 NOTE — Telephone Encounter (Signed)
Called and notified patient of Kate's comments. Patient verbalized understanding. Patient stated that she would like a referral to ENT.

## 2015-10-15 NOTE — Telephone Encounter (Signed)
Pt left v/m; pt was seen 10/08/15; pt is no better, pt has taken 2 bottles of mucinex, delsym and prescription cough med.Unable to reach pt by phone for additional info. [facility].

## 2015-10-15 NOTE — Telephone Encounter (Signed)
Noted. Will place referral.  

## 2015-10-15 NOTE — Telephone Encounter (Signed)
Called patient. She stated that she is still very nasal congestion. Still coughing up sputum and it greenish.

## 2015-10-15 NOTE — Telephone Encounter (Signed)
I will send in a Zpak for treatment, but am worried regarding her recent infections. She shouldn't be treated with antibiotics this frequently.  I highly recommend she consider having her tonsils removed as recommend in the past. Please let me know what she thinks about this.  Start Azithromycin antibiotics. Take 2 tablets by mouth today, then 1 tablet daily for 4 additional days.

## 2015-10-15 NOTE — Telephone Encounter (Signed)
What are her symptoms? What's her most bothersome symptom? Coughing up any sputum?

## 2015-11-05 DIAGNOSIS — J0110 Acute frontal sinusitis, unspecified: Secondary | ICD-10-CM | POA: Diagnosis not present

## 2015-11-11 ENCOUNTER — Telehealth: Payer: Self-pay | Admitting: Primary Care

## 2015-11-11 ENCOUNTER — Ambulatory Visit: Payer: BLUE CROSS/BLUE SHIELD | Admitting: Primary Care

## 2015-11-11 DIAGNOSIS — Z0289 Encounter for other administrative examinations: Secondary | ICD-10-CM

## 2015-11-11 NOTE — Telephone Encounter (Signed)
No urgent follow up is necessary.

## 2015-11-11 NOTE — Telephone Encounter (Signed)
Patient did not come for their scheduled appointment today discuss med.  Please let me know if the patient needs to be contacted immediately for follow up or if no follow up is necessary.

## 2016-01-02 ENCOUNTER — Other Ambulatory Visit: Payer: Self-pay | Admitting: Primary Care

## 2016-01-02 DIAGNOSIS — R519 Headache, unspecified: Secondary | ICD-10-CM

## 2016-01-02 DIAGNOSIS — R51 Headache: Principal | ICD-10-CM

## 2016-01-03 NOTE — Telephone Encounter (Signed)
Electronically refill request for   topiramate (TOPAMAX) 50 MG tablet   Take 1 tablet (50 mg total) by mouth at bedtime.  Dispense: 90 tablet   Refills: 0     Last prescribed on 10/08/2015. Last seen on 10/08/2015. No show on 11/11/2014. CPE is schedule on 01/16/2016.

## 2016-01-05 ENCOUNTER — Other Ambulatory Visit: Payer: Self-pay | Admitting: Primary Care

## 2016-01-05 DIAGNOSIS — Z0000 Encounter for general adult medical examination without abnormal findings: Secondary | ICD-10-CM

## 2016-01-09 ENCOUNTER — Other Ambulatory Visit: Payer: BLUE CROSS/BLUE SHIELD

## 2016-01-16 ENCOUNTER — Encounter: Payer: BLUE CROSS/BLUE SHIELD | Admitting: Primary Care

## 2016-01-16 DIAGNOSIS — Z0289 Encounter for other administrative examinations: Secondary | ICD-10-CM

## 2016-03-11 ENCOUNTER — Ambulatory Visit (INDEPENDENT_AMBULATORY_CARE_PROVIDER_SITE_OTHER): Payer: BLUE CROSS/BLUE SHIELD | Admitting: Family Medicine

## 2016-03-11 ENCOUNTER — Encounter: Payer: Self-pay | Admitting: Family Medicine

## 2016-03-11 VITALS — BP 112/82 | HR 91 | Temp 98.2°F | Ht 69.0 in | Wt 263.4 lb

## 2016-03-11 DIAGNOSIS — J069 Acute upper respiratory infection, unspecified: Secondary | ICD-10-CM | POA: Diagnosis not present

## 2016-03-11 DIAGNOSIS — H9201 Otalgia, right ear: Secondary | ICD-10-CM | POA: Diagnosis not present

## 2016-03-11 MED ORDER — AMOXICILLIN 500 MG PO CAPS: 500.0000 mg | ORAL_CAPSULE | Freq: Three times a day (TID) | ORAL | 0 refills | Status: AC

## 2016-03-11 MED ORDER — AMOXICILLIN 500 MG PO CAPS: 500.0000 mg | ORAL_CAPSULE | Freq: Three times a day (TID) | ORAL | 0 refills | Status: DC

## 2016-03-11 NOTE — Patient Instructions (Signed)
For ear pain, please take over the counter ibuprofen 2 to 3 tablets every 8 to 12 hours as needed for ear pain For nasal congestion, you can try over the counter antihistamines (generic Allegra, Claritin or Zyrtec work well) Drink a lot of fluids . If ear pain not improved in 3-4 days, you can start antibiotic   Earache An earache, also called otalgia, can be caused by many things. Pain from an earache can be sharp, dull, or burning. The pain may be temporary or constant. Earaches can be caused by problems with the ear, such as infection in either the middle ear or the ear canal, injury, impacted ear wax, middle ear pressure, or a foreign body in the ear. Ear pain can also result from problems in other areas. This is called referred pain. For example, pain can come from a sore throat, a tooth infection, or problems with the jaw or the joint between the jaw and the skull (temporomandibular joint, or TMJ). The cause of an earache is not always easy to identify. Watchful waiting may be appropriate for some earaches until a clear cause of the pain can be found. HOME CARE INSTRUCTIONS Watch your condition for any changes. The following actions may help to lessen any discomfort that you are feeling:  Take medicines only as directed by your health care provider. This includes ear drops.  Apply ice to your outer ear to help reduce pain.  Put ice in a plastic bag.  Place a towel between your skin and the bag.  Leave the ice on for 20 minutes, 2-3 times per day.  Do not put anything in your ear other than medicine that is prescribed by your health care provider.  Try resting in an upright position instead of lying down. This may help to reduce pressure in the middle ear and relieve pain.  Chew gum if it helps to relieve your ear pain.  Control any allergies that you have.  Keep all follow-up visits as directed by your health care provider. This is important. SEEK MEDICAL CARE IF:  Your pain  does not improve within 2 days.  You have a fever.  You have new or worsening symptoms. SEEK IMMEDIATE MEDICAL CARE IF:  You have a severe headache.  You have a stiff neck.  You have difficulty swallowing.  You have redness or swelling behind your ear.  You have drainage from your ear.  You have hearing loss.  You feel dizzy.   This information is not intended to replace advice given to you by your health care provider. Make sure you discuss any questions you have with your health care provider.   Document Released: 03/06/2004 Document Revised: 08/10/2014 Document Reviewed: 02/18/2014 Elsevier Interactive Patient Education [employer]2016 Elsevier Inc.

## 2016-03-11 NOTE — Progress Notes (Addendum)
Subjective:    Patient ID: Autumn Stanley, female    DOB: 02/05/1996, 20 y.o.   MRN: [id number]  HPI This is a 20 yo female who presents today with 4 days of right ear pain and 3 days of nasal congestion. Right ear throbbing with occasional stabbing pain. No ear drainage. Pain into jaw. Green nasal drainage. No cough, sore throat, some mild headache. Works at a day care with toddlers. No history of smoking, asthma or seasonal allergies. Has not tried any medication for symptoms.   Past Medical History:  Diagnosis Date  . Frequent headaches   . Kidney stone   . Migraine    Past Surgical History:  Procedure Laterality Date  . MOUTH SURGERY  2016   Family History  Problem Relation Age of Onset  . Arthritis Mother   . Breast cancer Mother   . Arthritis Maternal Grandmother   . Heart disease Maternal Grandmother   . Hyperlipidemia Maternal Grandmother   . Prostate cancer Maternal Grandfather   . Hyperlipidemia Maternal Grandfather   . Hyperlipidemia Paternal Grandmother   . Hyperlipidemia Paternal Grandfather    Social History  Substance Use Topics  . Smoking status: Never Smoker  . Smokeless tobacco: Not on file  . Alcohol use No      Review of Systems Per HPI    Objective:   Physical Exam  Constitutional: Autumn Stanley is oriented to person, place, and time. Autumn Stanley appears well-developed and well-nourished.  HENT:  Head: Normocephalic and atraumatic.  Right Ear: Ear canal normal. There is tenderness (mild tenderness to exam. ).  Left Ear: Tympanic membrane, external ear and ear canal normal.  Nose: Mucosal edema and rhinorrhea present. Right sinus exhibits no maxillary sinus tenderness and no frontal sinus tenderness. Left sinus exhibits no maxillary sinus tenderness and no frontal sinus tenderness.  Mouth/Throat: Uvula is midline. No oropharyngeal exudate, posterior oropharyngeal erythema or tonsillar abscesses.  Right TM with light reflex, slightly opaque. No erythema or bulging.    Tonsils +1, patient reports this is normal for her.    Cardiovascular: Normal rate, regular rhythm and normal heart sounds.   Pulmonary/Chest: Effort normal and breath sounds normal.  Neurological: Autumn Stanley is alert and oriented to person, place, and time.  Skin: Skin is warm.  Psychiatric: Autumn Stanley has a normal mood and affect. Her behavior is normal. Judgment and thought content normal.  Vitals reviewed.     BP 112/82 (BP Location: Left Arm, Patient Position: Sitting, Cuff Size: Large)   Pulse 91   Temp 98.2 F (36.8 C) (Oral)   Ht  (1.753 m)   Wt 263 lb 6.4 oz (119.5 kg)   SpO2 97%   BMI 38.90 kg/m  Wt Readings from Last 3 Encounters:  03/11/16 263 lb 6.4 oz (119.5 kg)  10/08/15 261 lb 12.8 oz (118.8 kg)  09/09/15 260 lb (117.9 kg)       Assessment & Plan:  1. Otalgia of right ear - suspect viral illness, provided wait and see prescription if not improved in 3-4 days. - encouraged ibuprofen, fluids, rest - amoxicillin (AMOXIL) 500 MG capsule; Take 1 capsule (500 mg total) by mouth 3 (three) times daily.  Dispense: 21 capsule; Refill: 0  2. Acute upper respiratory infection - see #1, wait and see prescription provided as well as symptomatic treatment suggestions (ibuprofen, OTC antihistamine) - amoxicillin (AMOXIL) 500 MG capsule; Take 1 capsule (500 mg total) by mouth 3 (three) times daily.  Dispense: 21 capsule; Refill: 0 -  RTC precautions reviewed.  Olean Reeeborah Arelys Glassco, FNP-BC   [facility], [facility]  03/11/2016 2:03 PM

## 2016-03-17 ENCOUNTER — Ambulatory Visit: Payer: BLUE CROSS/BLUE SHIELD | Admitting: Internal Medicine

## 2016-03-17 DIAGNOSIS — J039 Acute tonsillitis, unspecified: Secondary | ICD-10-CM | POA: Diagnosis not present

## 2016-03-17 DIAGNOSIS — Z0289 Encounter for other administrative examinations: Secondary | ICD-10-CM

## 2016-03-17 DIAGNOSIS — J3501 Chronic tonsillitis: Secondary | ICD-10-CM | POA: Diagnosis not present

## 2016-03-18 ENCOUNTER — Telehealth: Payer: Self-pay | Admitting: Primary Care

## 2016-03-18 NOTE — Telephone Encounter (Signed)
No follow up needed

## 2016-03-18 NOTE — Telephone Encounter (Signed)
Patient did not come in for their appointment on 03/17/16 for sore throat  Please let me know if patient needs to be contacted immediately for follow up or no follow up needed.

## 2016-05-26 ENCOUNTER — Ambulatory Visit (INDEPENDENT_AMBULATORY_CARE_PROVIDER_SITE_OTHER): Payer: BLUE CROSS/BLUE SHIELD | Admitting: Primary Care

## 2016-05-26 ENCOUNTER — Encounter: Payer: Self-pay | Admitting: Primary Care

## 2016-05-26 VITALS — BP 118/70 | HR 99 | Temp 98.7°F | Ht 69.0 in | Wt 267.8 lb

## 2016-05-26 DIAGNOSIS — R059 Cough, unspecified: Secondary | ICD-10-CM

## 2016-05-26 DIAGNOSIS — F32A Depression, unspecified: Secondary | ICD-10-CM

## 2016-05-26 DIAGNOSIS — N926 Irregular menstruation, unspecified: Secondary | ICD-10-CM | POA: Diagnosis not present

## 2016-05-26 DIAGNOSIS — F329 Major depressive disorder, single episode, unspecified: Secondary | ICD-10-CM

## 2016-05-26 DIAGNOSIS — R05 Cough: Secondary | ICD-10-CM

## 2016-05-26 DIAGNOSIS — F418 Other specified anxiety disorders: Secondary | ICD-10-CM | POA: Diagnosis not present

## 2016-05-26 DIAGNOSIS — R51 Headache: Secondary | ICD-10-CM

## 2016-05-26 DIAGNOSIS — R519 Headache, unspecified: Secondary | ICD-10-CM

## 2016-05-26 DIAGNOSIS — F419 Anxiety disorder, unspecified: Principal | ICD-10-CM

## 2016-05-26 MED ORDER — BENZONATATE 200 MG PO CAPS: 200.0000 mg | ORAL_CAPSULE | Freq: Three times a day (TID) | ORAL | 0 refills | Status: DC | PRN

## 2016-05-26 MED ORDER — TOPIRAMATE 50 MG PO TABS: ORAL_TABLET | ORAL | 1 refills | Status: DC

## 2016-05-26 MED ORDER — SUMATRIPTAN SUCCINATE 25 MG PO TABS: ORAL_TABLET | ORAL | 0 refills | Status: DC

## 2016-05-26 NOTE — Progress Notes (Signed)
Subjective:    Patient ID: Autumn Stanley, female    DOB: [birth year], 20 y.o.   MRN: [id number]  HPI  Autumn Stanley is a 20 year old female who presents today with multiple complaints.  1) Cough: Present since Sunday October 15th. Denies sore throat, fevers, chills. She will cough up chunks of thick green sputum in the mornings. She's taken Nyquil, Dayquil, cough drops, using her albuterol inhaler, and taking cough syrup with codeine without improvement in her cough. Overall she's feeling much better, but still struggles with the cough.   2) Frequent Headaches: Currently managed on Topamax 50 mg tablets. She's forgotten to take the Topamax for the past 2 months and noticed increased frequency in headache. She restarted her Topamax 1 month ago and has had improvement. Her migraines occur 1-2 times monthly with photophobia and nausea. She does not have a prescription for abortive treatment.  3) Irregular Menstrual Cycle: History of irregular periods in the past. Currently managed on OCP's which did well to regulate her periods up until she missed numerous doses and in July and took irregularly. She had a very light episode of menses in August that lasted 1 day. She did not experience menses in September. She was due to start her period on October 21st and has symptoms of breast tenderness and pelvic cramping without presence of menses. She's been taking her OCPs consistently for 1 week.  4) Anxiety and Depression: Symptoms of breaking down at work with crying spells, little interest in spending time with family or friends, wanting to go to bed when she gets home from work. Symptoms have been present for the past 2 months. She has been under increased stress at work as they are short handed. GAD 7 score of 17 and PHQ 9 score of 10.   Review of Systems  HENT: Positive for congestion and postnasal drip. Negative for sinus pressure and sore throat.   Respiratory: Positive for cough. Negative for shortness of  breath.   Cardiovascular: Negative for chest pain.  Allergic/Immunologic: Positive for environmental allergies.  Neurological: Positive for headaches. Negative for dizziness.  Psychiatric/Behavioral: Negative for sleep disturbance. The patient is nervous/anxious.        Symptoms of depression       Past Medical History:  Diagnosis Date  . Frequent headaches   . Kidney stone   . Migraine      Social History   Social History  . Marital status: Single    Spouse name: N/A  . Number of children: N/A  . Years of education: N/A   Occupational History  . daycare worker    Social History Main Topics  . Smoking status: Never Smoker  . Smokeless tobacco: Not on file  . Alcohol use No  . Drug use: No  . Sexual activity: Not on file     Comment: occasionally sexually active   Other Topics Concern  . Not on file   Social History Narrative  . No narrative on file    Past Surgical History:  Procedure Laterality Date  . MOUTH SURGERY  2016    Family History  Problem Relation Age of Onset  . Arthritis Mother   . Breast cancer Mother   . Arthritis Maternal Grandmother   . Heart disease Maternal Grandmother   . Hyperlipidemia Maternal Grandmother   . Prostate cancer Maternal Grandfather   . Hyperlipidemia Maternal Grandfather   . Hyperlipidemia Paternal Grandmother   . Hyperlipidemia Paternal Grandfather  No Known Allergies  Current Outpatient Prescriptions on File Prior to Visit  Medication Sig Dispense Refill  . norgestimate-ethinyl estradiol (ORTHO-CYCLEN,SPRINTEC,PREVIFEM) 0.25-35 MG-MCG tablet Take 1 tablet by mouth daily. 3 Package 3   No current facility-administered medications on file prior to visit.     BP 118/70   Pulse 99   Temp 98.7 F (37.1 C) (Oral)   Ht 5\' 9"  (1.753 m)   Wt 267 lb 12.8 oz (121.5 kg)   LMP 03/14/2016   SpO2 99%   BMI 39.55 kg/m    Objective:   Physical Exam  Constitutional: She is oriented to person, place, and time.  She appears well-nourished.  Eyes: EOM are normal. Pupils are equal, round, and reactive to light.  Cardiovascular: Normal rate and regular rhythm.   Pulmonary/Chest: Effort normal and breath sounds normal.  Neurological: She is alert and oriented to person, place, and time. No cranial nerve deficit.  Skin: Skin is warm and dry.  Psychiatric: She has a normal mood and affect.          Assessment & Plan:  Cough:  Present for 2 weeks. Overall feeling better. Symptoms of sore throat, fatigue, ear pain have improved. Exam today with clear lungs. Evidence of cobblestoning to posterior pharynx. Suspect post viral cough with combination of environmental allergies. Discussed OTC treatment. Rx for Tessalon pearls sent into pharmacy. Follow up PRN.  Morrie Sheldonlark,Katherine Kendal, NP

## 2016-05-26 NOTE — Assessment & Plan Note (Signed)
Symptoms present for 2 months, increased stress at work. PHQ 9 score of 10 and GAD 7 score of 17 today. Given duration of symptoms will start with CBT for treatment. Referral placed for therapy. Denies SI/HI.

## 2016-05-26 NOTE — Assessment & Plan Note (Signed)
Managed on OCP's to help regulate which worked well until she stopped taking. Discussed that this may take 3-6 months for her cycles to regulate again. Discussed to set an alarm on her phone for a reminder.

## 2016-05-26 NOTE — Patient Instructions (Addendum)
I sent refills of Topamax to your pharmacy for headache prevention. Take 1 tablet by mouth at bedtime.  I sent in a prescription for Imitrex (sumatriptan) tablets to your pharmacy. Take 1 tablet by mouth at migraine onset. May repeat in 2 hours if your migraine persists. Do not exceed 2 tablets in 24 hours.  You may take Benzonatate capsules for cough. Take 1 capsule by mouth three times daily as needed for cough.  You will be contacted regarding your referral to therapy.  Please let us know if you have not heard back within one week.   Continue to take your birth control pills daily as prescribed. You will need to allow 3-6 months for complete regulation of your periods.  It was a pleasure to see you today!

## 2016-05-26 NOTE — Assessment & Plan Note (Signed)
Stopped taking Topamax for 2 months as she forgot, improvement in headaches since re-initiation. Refill provided today. Abortive treatment for Imitrex sent to pharmacy with specific instructions for use.

## 2016-05-26 NOTE — Progress Notes (Signed)
Pre visit review using our clinic review tool, if applicable. No additional management support is needed unless otherwise documented below in the visit note. 

## 2016-05-29 ENCOUNTER — Encounter: Payer: Self-pay | Admitting: Primary Care

## 2016-06-01 ENCOUNTER — Other Ambulatory Visit: Payer: Self-pay | Admitting: Primary Care

## 2016-06-01 DIAGNOSIS — J069 Acute upper respiratory infection, unspecified: Secondary | ICD-10-CM

## 2016-06-01 MED ORDER — AZITHROMYCIN 250 MG PO TABS: ORAL_TABLET | ORAL | 0 refills | Status: DC

## 2016-06-01 NOTE — Telephone Encounter (Signed)
Pt left v/m; pt seen 05/26/16; pt is no better;still prod cough with thick green - yellow mucus; pt does feel worse than when seen.Sinus pain again. Pt not sure if has fever;no way to ck temp.pt request abx. Tessalon is not helping cough. [facility] [place].

## 2016-06-04 ENCOUNTER — Encounter: Payer: Self-pay | Admitting: Primary Care

## 2016-06-04 ENCOUNTER — Ambulatory Visit (INDEPENDENT_AMBULATORY_CARE_PROVIDER_SITE_OTHER): Payer: BLUE CROSS/BLUE SHIELD | Admitting: Primary Care

## 2016-06-04 VITALS — BP 118/72 | HR 96 | Temp 98.0°F | Ht 69.0 in | Wt 269.8 lb

## 2016-06-04 DIAGNOSIS — J069 Acute upper respiratory infection, unspecified: Secondary | ICD-10-CM | POA: Diagnosis not present

## 2016-06-04 MED ORDER — HYDROCODONE-HOMATROPINE 5-1.5 MG/5ML PO SYRP: 5.0000 mL | ORAL_SOLUTION | Freq: Three times a day (TID) | ORAL | 0 refills | Status: DC | PRN

## 2016-06-04 MED ORDER — PREDNISONE 20 MG PO TABS: ORAL_TABLET | ORAL | 0 refills | Status: DC

## 2016-06-04 NOTE — Progress Notes (Signed)
Subjective:    Patient ID: Olive Bassshley Spann, female    DOB: [birth year], 20 y.o.   MRN: [id number]  HPI  [name] is a 20 year old female who presents today with a chief complaint of cough. She also reports chest congestion, sore throat, nasal congestion.   She was evaluated on 10/24 with a 9 day history of productive cough, sinus pressure, and fatigue. She was treated with supportive measures with Tessalon Pearls given stable exam. She phoned in on 10/30 with complaints of increased symptoms, therefore, Azithromycin was sent to her pharmacy.   She presents today with continued symptoms. Her cough continues to be productive with green mucous, she's noticed continued sinus pressure, ear pain, and shortness of breath. She's not noticed any improvement since starting the antibiotics. She's been taking Mucinex and using her albuterol inhaler without much improvement. She works at a daycare.  Review of Systems  Constitutional: Positive for fatigue. Negative for chills and fever.  HENT: Positive for congestion, ear pain, sinus pressure and sore throat.   Respiratory: Positive for cough and shortness of breath.   Cardiovascular: Negative for chest pain.       Past Medical History:  Diagnosis Date  . Frequent headaches   . Kidney stone   . Migraine      Social History   Social History  . Marital status: Single    Spouse name: N/A  . Number of children: N/A  . Years of education: N/A   Occupational History  . daycare worker    Social History Main Topics  . Smoking status: Never Smoker  . Smokeless tobacco: Not on file  . Alcohol use No  . Drug use: No  . Sexual activity: Not on file     Comment: occasionally sexually active   Other Topics Concern  . Not on file   Social History Narrative  . No narrative on file    Past Surgical History:  Procedure Laterality Date  . MOUTH SURGERY  2016    Family History  Problem Relation Age of Onset  . Arthritis Mother   . Breast  cancer Mother   . Arthritis Maternal Grandmother   . Heart disease Maternal Grandmother   . Hyperlipidemia Maternal Grandmother   . Prostate cancer Maternal Grandfather   . Hyperlipidemia Maternal Grandfather   . Hyperlipidemia Paternal Grandmother   . Hyperlipidemia Paternal Grandfather     No Known Allergies  Current Outpatient Prescriptions on File Prior to Visit  Medication Sig Dispense Refill  . azithromycin (ZITHROMAX) 250 MG tablet Take 2 tablets by mouth today, then 1 tablet daily for 4 additional days. 6 each 0  . norgestimate-ethinyl estradiol (ORTHO-CYCLEN,SPRINTEC,PREVIFEM) 0.25-35 MG-MCG tablet Take 1 tablet by mouth daily. 3 Package 3  . SUMAtriptan (IMITREX) 25 MG tablet Take 1 tablet at migraine onset. May repeat in 2 hours if headache persists or recurs. Do not exceed 2 tablets in 24 hours. 10 tablet 0  . topiramate (TOPAMAX) 50 MG tablet TAKE 1 TABLET(50 MG) BY MOUTH AT BEDTIME 90 tablet 1  . benzonatate (TESSALON) 200 MG capsule Take 1 capsule (200 mg total) by mouth 3 (three) times daily as needed for cough. (Patient not taking: Reported on 06/04/2016) 30 capsule 0   No current facility-administered medications on file prior to visit.     BP 118/72   Pulse 96   Temp 98 F (36.7 C) (Oral)   Ht 5\' 9"  (1.753 m)   Wt 269 lb 12.8 oz (122.4  kg)   LMP 05/26/2016   SpO2 98%   BMI 39.84 kg/m    Objective:   Physical Exam  Constitutional: She appears well-nourished.  HENT:  Right Ear: Ear canal normal. Tympanic membrane is bulging. Tympanic membrane is not erythematous.  Left Ear: Ear canal normal. Tympanic membrane is bulging. Tympanic membrane is not erythematous.  Nose: Mucosal edema present. Right sinus exhibits no maxillary sinus tenderness and no frontal sinus tenderness. Left sinus exhibits no maxillary sinus tenderness and no frontal sinus tenderness.  Mouth/Throat: Posterior oropharyngeal erythema present. No oropharyngeal exudate or posterior oropharyngeal  edema.  Eyes: Conjunctivae are normal.  Neck: Neck supple.  Cardiovascular: Normal rate and regular rhythm.   Pulmonary/Chest: Effort normal. She has wheezes in the left upper field. She has no rales.  Tight airways  Lymphadenopathy:    She has no cervical adenopathy.  Skin: Skin is warm and dry.          Assessment & Plan:  Acute upper respiratory tract infection:  2+ week history of cough, fatigue, congestion. Recently treated with azithromycin antibiotics without improvement. Exam today with mild wheezing to left upper fields, bulging to bilateral TMs without obvious infection ,otherwise unremarkable. Dry cough present during exam. Vital signs stable. Suspect postviral cough given lack of improvement with antibiotics and stable exam. Given wheezing and tightness to airways, will prescribe short burst of prednisone. Prescription for Hycodan cough syrup printed. Discussed importance of hydration. She will update next week if no improvement.  Morrie Sheldonlark,Kesler Wickham Kendal, NP

## 2016-06-04 NOTE — Progress Notes (Signed)
Pre visit review using our clinic review tool, if applicable. No additional management support is needed unless otherwise documented below in the visit note. 

## 2016-06-04 NOTE — Patient Instructions (Signed)
Start prednisone tablets. Take 2 tablets daily for 5 days.  Use your albuterol inhaler every 6 hours as needed for wheezing/shortness of breath.  Ensure you are consuming 64 ounces of water daily.  You may take the Hycodan cough suppressant every 8 hours as needed for cough and rest. Caution this medication contains codeine and will make you feel drowsy.  Please e-mail me in 3 days if no improvement.  It was a pleasure to see you today!

## 2016-06-04 NOTE — Telephone Encounter (Signed)
Pt left v/m that pt sent mychart message with details that pt is not feeling any better; pt will finish abx on 06/05/16. Pt does not want to schedule appt if not going to be given something that will help get rid of cough and feeling bad for 3 weeks. Pt request cb [phone]. Pt last seen 05/26/16.

## 2016-06-09 ENCOUNTER — Telehealth: Payer: Self-pay | Admitting: Primary Care

## 2016-06-09 NOTE — Telephone Encounter (Signed)
Noted  

## 2016-06-09 NOTE — Telephone Encounter (Signed)
Received in basket message: Terri L Slaugenhaupt, CCC-SLP  Buena IrishLinda H Thomas        FYI: We called your patient and she has declined to schedule an appointment with us, at this time, due to financial reasons.   We appreciate the referral.    From [facility]

## 2016-07-08 ENCOUNTER — Ambulatory Visit (INDEPENDENT_AMBULATORY_CARE_PROVIDER_SITE_OTHER): Payer: BLUE CROSS/BLUE SHIELD | Admitting: Primary Care

## 2016-07-08 ENCOUNTER — Ambulatory Visit (INDEPENDENT_AMBULATORY_CARE_PROVIDER_SITE_OTHER)
Admission: RE | Admit: 2016-07-08 | Discharge: 2016-07-08 | Disposition: A | Discharge status: Home / Self Care | Payer: BLUE CROSS/BLUE SHIELD | Source: Ambulatory Visit | Attending: Primary Care | Admitting: Primary Care

## 2016-07-08 ENCOUNTER — Encounter: Payer: Self-pay | Admitting: Primary Care

## 2016-07-08 VITALS — BP 126/84 | HR 99 | Temp 97.8°F | Ht 63.0 in | Wt 271.4 lb

## 2016-07-08 DIAGNOSIS — R0789 Other chest pain: Secondary | ICD-10-CM | POA: Diagnosis not present

## 2016-07-08 DIAGNOSIS — M542 Cervicalgia: Secondary | ICD-10-CM | POA: Diagnosis not present

## 2016-07-08 DIAGNOSIS — S299XXA Unspecified injury of thorax, initial encounter: Secondary | ICD-10-CM | POA: Diagnosis not present

## 2016-07-08 DIAGNOSIS — R079 Chest pain, unspecified: Secondary | ICD-10-CM | POA: Diagnosis not present

## 2016-07-08 LAB — COMPREHENSIVE METABOLIC PANEL
ALK PHOS: 68 U/L (ref 39–117)
ALT: 25 U/L (ref 0–35)
AST: 16 U/L (ref 0–37)
Albumin: 4.4 g/dL (ref 3.5–5.2)
BUN: 11 mg/dL (ref 6–23)
CO2: 27 mEq/L (ref 19–32)
Calcium: 9.6 mg/dL (ref 8.4–10.5)
Chloride: 106 mEq/L (ref 96–112)
Creatinine, Ser: 0.79 mg/dL (ref 0.40–1.20)
GFR: 97.19 mL/min (ref >60.00)
GLUCOSE: 103 mg/dL — AB (ref 70–99)
POTASSIUM: 4.3 meq/L (ref 3.5–5.1)
Sodium: 139 mEq/L (ref 135–145)
TOTAL PROTEIN: 7.7 g/dL (ref 6.0–8.3)
Total Bilirubin: 0.4 mg/dL (ref 0.2–1.2)

## 2016-07-08 LAB — CBC
HEMATOCRIT: 38.2 % (ref 36.0–46.0)
Hemoglobin: 12.9 g/dL (ref 12.0–15.0)
MCHC: 33.8 g/dL (ref 30.0–36.0)
MCV: 86.1 fl (ref 78.0–100.0)
Platelets: 376 10*3/uL (ref 150.0–400.0)
RBC: 4.44 Mil/uL (ref 3.87–5.11)
RDW: 13.6 % (ref 11.5–15.5)
WBC: 10.6 10*3/uL — AB (ref 4.0–10.5)

## 2016-07-08 MED ORDER — METHOCARBAMOL 500 MG PO TABS: 500.0000 mg | ORAL_TABLET | Freq: Four times a day (QID) | ORAL | 0 refills | Status: DC | PRN

## 2016-07-08 NOTE — Patient Instructions (Signed)
Complete xray(s) and labs prior to leaving today. I will notify you of your results once received.  You may take Robaxin 500 mg tablets for muscle spasms. Take 1 tablet by mouth every 6 hours as needed for muscle spasm.  You may take Tylenol as needed for pain. Do not exceed 3000 mg in 24 hours.  You will be sore for the next week, worse today and perhaps tomorrow. Please notify me if you notice more bruising and/or no improvement in the bruising you currently have.  It was a pleasure to see you today!

## 2016-07-08 NOTE — Progress Notes (Signed)
Pre visit review using our clinic review tool, if applicable. No additional management support is needed unless otherwise documented below in the visit note. 
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
## 2016-07-16 ENCOUNTER — Ambulatory Visit (INDEPENDENT_AMBULATORY_CARE_PROVIDER_SITE_OTHER): Payer: BLUE CROSS/BLUE SHIELD | Admitting: Internal Medicine

## 2016-07-16 ENCOUNTER — Encounter: Payer: Self-pay | Admitting: Internal Medicine

## 2016-07-16 VITALS — BP 128/78 | HR 89 | Temp 98.9°F | Wt 274.0 lb

## 2016-07-16 DIAGNOSIS — R05 Cough: Secondary | ICD-10-CM

## 2016-07-16 DIAGNOSIS — B084 Enteroviral vesicular stomatitis with exanthem: Secondary | ICD-10-CM | POA: Diagnosis not present

## 2016-07-16 DIAGNOSIS — R059 Cough, unspecified: Secondary | ICD-10-CM

## 2016-07-16 MED ORDER — HYDROCODONE-HOMATROPINE 5-1.5 MG/5ML PO SYRP
5.0000 mL | ORAL_SOLUTION | Freq: Three times a day (TID) | ORAL | 0 refills | Status: DC | PRN
Start: 2016-07-16 — End: 2017-04-21

## 2016-07-17 ENCOUNTER — Encounter: Payer: Self-pay | Admitting: Internal Medicine

## 2016-07-17 NOTE — Progress Notes (Signed)
Subjective:    Patient ID: Autumn Stanley, female    DOB: Mar 09, 1996, 20 y.o.   MRN: [id number]  HPI  Pt presents to the clinic today with c/o lesions in her mouth and on her hands. She noticed the spots this morning. The spots burn and are painful. She has not noticed the lesions anywhere else. She denies runny nose, nasal congestion, or sore throat. She has had a slight, non productive cough, which seems worse at night. She denies fever, chills or body aches. 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 She has not taken anything OTC for this.  Review of Systems  Past Medical History:  Diagnosis Date  . Frequent headaches   . Kidney stone   . Migraine     Current Outpatient Prescriptions  Medication Sig Dispense Refill  . methocarbamol (ROBAXIN) 500 MG tablet Take 1 tablet (500 mg total) by mouth every 6 (six) hours as needed for muscle spasms. 40 tablet 0  . norgestimate-ethinyl estradiol (ORTHO-CYCLEN,SPRINTEC,PREVIFEM) 0.25-35 MG-MCG tablet Take 1 tablet by mouth daily. 3 Package 3  . SUMAtriptan (IMITREX) 25 MG tablet Take 1 tablet at migraine onset. May repeat in 2 hours if headache persists or recurs. Do not exceed 2 tablets in 24 hours. 10 tablet 0  . topiramate (TOPAMAX) 50 MG tablet TAKE 1 TABLET(50 MG) BY MOUTH AT BEDTIME 90 tablet 1  . HYDROcodone-homatropine (HYCODAN) 5-1.5 MG/5ML syrup Take 5 mLs by mouth every 8 (eight) hours as needed for cough. 120 mL 0   No current facility-administered medications for this visit.     No Known Allergies  Family History  Problem Relation Age of Onset  . Arthritis Mother   . Breast cancer Mother   . Arthritis Maternal Grandmother   . Heart disease Maternal Grandmother   . Hyperlipidemia Maternal Grandmother   . Prostate cancer Maternal Grandfather   . Hyperlipidemia Maternal Grandfather   . Hyperlipidemia Paternal Grandmother   . Hyperlipidemia Paternal Grandfather     Social History     Social History  . Marital status: Single    Spouse name: N/A  . Number of children: N/A  . Years of education: N/A   Occupational History  . daycare worker    Social History Main Topics  . Smoking status: Never Smoker  . Smokeless tobacco: Not on file  . Alcohol use No  . Drug use: No  . Sexual activity: Not on file     Comment: occasionally sexually active   Other Topics Concern  . Not on file   Social History Narrative  . No narrative on file     Constitutional: Denies fever, malaise, fatigue, headache or abrupt weight changes.  HEENT: Pt reports oral lesions. Denies eye pain, eye redness, ear pain, ringing in the ears, wax buildup, runny nose, nasal congestion, bloody nose, or sore throat. Respiratory: Pt reports cough. Denies difficulty breathing, shortness of breath, or sputum production.   Skin: Pt reports rash on hands. Denies redness or ulcercations.    No other specific complaints in a complete review of systems (except as listed in HPI above).     Objective:   Physical Exam   BP 128/78   Pulse 89   Temp 98.9 F (37.2 C) (Oral)   Wt 274 lb (124.3 kg)   LMP 06/24/2016   SpO2 99%   BMI 48.54 kg/m  Wt Readings from Last 3 Encounters:  07/16/16 274 lb (124.3 kg)  07/08/16 271 lb 6.4 oz (123.1 kg)  06/04/16 269 lb 12.8 oz (122.4 kg)    General: Appears her stated age, in NAD. Skin: Scattered, round papular lesions noted on bilateral palms.  HEENT: Throat/Mouth: Teeth present, mucosa pink and moist. Ulcerations noted on hard palette.  Neck:  No adenopathy noted.  Cardiovascular: Normal rate and rhythm. S1,S2 noted.   Pulmonary/Chest: Normal effort and positive vesicular breath sounds. No respiratory distress. No wheezes, rales or ronchi noted.   BMET    Component Value Date/Time   NA 139 07/08/2016 1529   K 4.3 07/08/2016 1529   CL 106 07/08/2016 1529   CO2 27 07/08/2016 1529   GLUCOSE 103 (H) 07/08/2016 1529   BUN 11 07/08/2016 1529    CREATININE 0.79 07/08/2016 1529   CALCIUM 9.6 07/08/2016 1529    Lipid Panel  No results found for: CHOL, TRIG, HDL, CHOLHDL, VLDL, LDLCALC  CBC    Component Value Date/Time   WBC 10.6 (H) 07/08/2016 1529   RBC 4.44 07/08/2016 1529   HGB 12.9 07/08/2016 1529   HCT 38.2 07/08/2016 1529   PLT 376.0 07/08/2016 1529   MCV 86.1 07/08/2016 1529   MCHC 33.8 07/08/2016 1529   RDW 13.6 07/08/2016 1529    Hgb A1C No results found for: HGBA1C         Assessment & Plan:   Hand, foot and mouth disease:  Discussed transmission of virus Offered RX for [employer] Corporation[employer] Mouthwash with Lidocaine- she declines Work note provided  Viral Cough:  RX for Hycodan for cough  RTC as needed or if symptoms persist or worsen Autumn Passon, NP

## 2016-07-17 NOTE — Patient Instructions (Signed)
Cough, Adult Introduction A cough helps to clear your throat and lungs. A cough may last only 2-3 weeks (acute), or it may last longer than 8 weeks (chronic). Many different things can cause a cough. A cough may be a sign of an illness or another medical condition. Follow these instructions at home:  Pay attention to any changes in your cough.  Take medicines only as told by your doctor.  If you were prescribed an antibiotic medicine, take it as told by your doctor. Do not stop taking it even if you start to feel better.  Talk with your doctor before you try using a cough medicine.  Drink enough fluid to keep your pee (urine) clear or pale yellow.  If the air is dry, use a cold steam vaporizer or humidifier in your home.  Stay away from things that make you cough at work or at home.  If your cough is worse at night, try using extra pillows to raise your head up higher while you sleep.  Do not smoke, and try not to be around smoke. If you need help quitting, ask your doctor.  Do not have caffeine.  Do not drink alcohol.  Rest as needed. Contact a doctor if:  You have new problems (symptoms).  You cough up yellow fluid (pus).  Your cough does not get better after 2-3 weeks, or your cough gets worse.  Medicine does not help your cough and you are not sleeping well.  You have pain that gets worse or pain that is not helped with medicine.  You have a fever.  You are losing weight and you do not know why.  You have night sweats. Get help right away if:  You cough up blood.  You have trouble breathing.  Your heartbeat is very fast. This information is not intended to replace advice given to you by your health care provider. Make sure you discuss any questions you have with your health care provider. Document Released: 04/02/2011 Document Revised: 12/26/2015 Document Reviewed: 09/26/2014  2017 Elsevier  

## 2016-07-23 ENCOUNTER — Other Ambulatory Visit: Payer: Self-pay | Admitting: Primary Care

## 2016-07-23 DIAGNOSIS — Z30011 Encounter for initial prescription of contraceptive pills: Secondary | ICD-10-CM

## 2016-08-09 ENCOUNTER — Encounter: Payer: Self-pay | Admitting: Primary Care

## 2016-08-13 ENCOUNTER — Other Ambulatory Visit: Payer: Self-pay | Admitting: Primary Care

## 2016-08-13 DIAGNOSIS — R51 Headache: Principal | ICD-10-CM

## 2016-08-13 DIAGNOSIS — R519 Headache, unspecified: Secondary | ICD-10-CM

## 2016-08-13 MED ORDER — TOPIRAMATE 50 MG PO TABS: ORAL_TABLET | ORAL | 1 refills | Status: DC

## 2016-08-13 MED ORDER — SUMATRIPTAN SUCCINATE 25 MG PO TABS: ORAL_TABLET | ORAL | 0 refills | Status: DC

## 2016-08-14 ENCOUNTER — Other Ambulatory Visit: Payer: Self-pay | Admitting: Primary Care

## 2016-08-14 DIAGNOSIS — R51 Headache: Principal | ICD-10-CM

## 2016-08-14 DIAGNOSIS — R519 Headache, unspecified: Secondary | ICD-10-CM

## 2016-08-31 ENCOUNTER — Telehealth: Payer: Self-pay | Admitting: Primary Care

## 2016-08-31 NOTE — Telephone Encounter (Signed)
-----   Message from Doreene NestKatherine K Lataria Courser, NP sent at 08/10/2016  7:00 PM EST ----- Regarding: headaches  How's her headaches since we increased her Topamax dose to 100 mg?

## 2016-08-31 NOTE — Telephone Encounter (Signed)
Message left for patient to return my call.  

## 2016-09-03 NOTE — Telephone Encounter (Addendum)
Spoken to patient and she stated that she did not have a headache since the increased of the Topamax. However, she did have a mild headache today but it was the first since the increase. Patient is doing good.

## 2016-09-04 NOTE — Telephone Encounter (Signed)
Noted and glad to hear! 

## 2016-10-23 DIAGNOSIS — B999 Unspecified infectious disease: Secondary | ICD-10-CM | POA: Diagnosis not present

## 2016-10-26 ENCOUNTER — Encounter: Payer: Self-pay | Admitting: Primary Care

## 2016-10-26 ENCOUNTER — Ambulatory Visit (INDEPENDENT_AMBULATORY_CARE_PROVIDER_SITE_OTHER): Payer: BLUE CROSS/BLUE SHIELD | Admitting: Primary Care

## 2016-10-26 VITALS — BP 116/74 | HR 102 | Temp 98.2°F | Ht 69.0 in | Wt 242.0 lb

## 2016-10-26 DIAGNOSIS — W57XXXD Bitten or stung by nonvenomous insect and other nonvenomous arthropods, subsequent encounter: Secondary | ICD-10-CM

## 2016-10-26 DIAGNOSIS — L03116 Cellulitis of left lower limb: Secondary | ICD-10-CM

## 2016-10-26 NOTE — Patient Instructions (Addendum)
Continue the bactrim DS antibiotics as prescribed.  Apply warm compresses to the site to allow drainage. Keep the site covered for protection, allow it to drain.  Please call me if no improvement by Thursday this week.  It was a pleasure to see you today!

## 2016-10-26 NOTE — Progress Notes (Signed)
Pre visit review using our clinic review tool, if applicable. No additional management support is needed unless otherwise documented below in the visit note. 

## 2016-10-26 NOTE — Progress Notes (Signed)
Subjective:    Patient ID: Autumn Stanley, female    DOB: 01/24/1996, 21 y.o.   MRN: [id number]  HPI  Ms. Autumn Stanley is a 21 year old female who presents today with a chief complaint of insect bite. She was bitten by a spider to her left posterior lower extremity distal to her knee. She first noticed this four days ago. The pain increased the next day so she presented to Urgent Care on 10/23/16. She was diagnosed with cellulitis and was prescribed a course of bactrim DS tablets which she started on 10/23/16. She's not done anything else to the site.   Over the past 24-48 hours the site is much larger and uncomfortable. She tried to pop the wound and was able to expel a slight amount of puss, but continues to experience pressure. She denies fevers, body aches, headaches, rash.   Review of Systems  Constitutional: Negative for fatigue and fever.  Musculoskeletal: Negative for myalgias.  Skin: Positive for color change and wound.  Neurological: Negative for headaches.       Past Medical History:  Diagnosis Date  . Frequent headaches   . Kidney stone   . Migraine      Social History   Social History  . Marital status: Single    Spouse name: N/A  . Number of children: N/A  . Years of education: N/A   Occupational History  . daycare worker    Social History Main Topics  . Smoking status: Never Smoker  . Smokeless tobacco: Never Used  . Alcohol use No  . Drug use: No  . Sexual activity: Not on file     Comment: occasionally sexually active   Other Topics Concern  . Not on file   Social History Narrative  . No narrative on file    Past Surgical History:  Procedure Laterality Date  . MOUTH SURGERY  2016    Family History  Problem Relation Age of Onset  . Arthritis Mother   . Breast cancer Mother   . Arthritis Maternal Grandmother   . Heart disease Maternal Grandmother   . Hyperlipidemia Maternal Grandmother   . Prostate cancer Maternal Grandfather   . Hyperlipidemia  Maternal Grandfather   . Hyperlipidemia Paternal Grandmother   . Hyperlipidemia Paternal Grandfather     No Known Allergies  Current Outpatient Prescriptions on File Prior to Visit  Medication Sig Dispense Refill  . HYDROcodone-homatropine (HYCODAN) 5-1.5 MG/5ML syrup Take 5 mLs by mouth every 8 (eight) hours as needed for cough. 120 mL 0  . methocarbamol (ROBAXIN) 500 MG tablet Take 1 tablet (500 mg total) by mouth every 6 (six) hours as needed for muscle spasms. 40 tablet 0  . MONONESSA 0.25-35 MG-MCG tablet TAKE 1 TABLET BY MOUTH DAILY 3 Package 3  . SUMAtriptan (IMITREX) 25 MG tablet Take 1 tablet at migraine onset. May repeat in 2 hours if headache persists or recurs. Do not exceed 2 tablets in 24 hours. 10 tablet 0  . topiramate (TOPAMAX) 50 MG tablet Take 1 tablet by mouth twice daily for migraine prevention. 180 tablet 1   No current facility-administered medications on file prior to visit.     BP 116/74   Pulse (!) 102   Temp 98.2 F (36.8 C) (Oral)   Ht 5\' 9"  (1.753 m)   Wt 242 lb (109.8 kg)   LMP 10/06/2016   SpO2 98%   BMI 35.74 kg/m    Objective:   Physical Exam  Constitutional: She  appears well-nourished.  Neck: Neck supple.  Cardiovascular: Normal rate and regular rhythm.   Pulmonary/Chest: Effort normal and breath sounds normal.  Skin: Skin is warm and dry.  3-4 cm circular deep wound to left posterior lower extremity distal to knee. Tender. Appears infectious. Non fluctuant.          Assessment & Plan:  Insect Bite:  Appears infectious. Compliant to Bactrim DS tablets.  Unable to expel puss from site today. Cleansed site and removed scabbing. Discussed to use warm compresses 2-3 times daily to allow for drainage. Bactrim does appear to be working at this time, continue. She will notify by Thursday this week if no improvement or if symptoms progress.  Morrie Sheldonlark,Debera Sterba Kendal, NP

## 2016-11-25 ENCOUNTER — Telehealth: Payer: Self-pay

## 2016-11-25 NOTE — Telephone Encounter (Signed)
Pt said when picked up topamax at [facility] recently topamax was still 50 mg but instructions are one daily and only got # 90. I called [facility] and spoke with Clifton Custard and he said what happened was pt picked up rx for topamax that was still on pts profile from last year. Clifton Custard could not tell me why but did say when pt ran out of rx there were no more refills on that rx and pt could get the topamax 50 mg instructions 1 bid with qty # 180. I advised pt and she wanted to know if could take the topamax 50 mg bid and advised pt yes. Pt wants refill sent to [facility]. [facility]

## 2016-12-27 DIAGNOSIS — J020 Streptococcal pharyngitis: Secondary | ICD-10-CM | POA: Diagnosis not present

## 2017-01-09 ENCOUNTER — Other Ambulatory Visit: Payer: Self-pay | Admitting: Primary Care

## 2017-01-09 DIAGNOSIS — R519 Headache, unspecified: Secondary | ICD-10-CM

## 2017-01-09 DIAGNOSIS — R51 Headache: Principal | ICD-10-CM

## 2017-01-11 NOTE — Telephone Encounter (Signed)
Pt called to verify topiramate was being filled for 90 day rx. Advised pt yes was filled for 90 day; pt will ck with pharmacy.

## 2017-02-16 ENCOUNTER — Other Ambulatory Visit: Payer: Self-pay | Admitting: Primary Care

## 2017-02-16 DIAGNOSIS — R51 Headache: Principal | ICD-10-CM

## 2017-02-16 DIAGNOSIS — R519 Headache, unspecified: Secondary | ICD-10-CM

## 2017-02-25 ENCOUNTER — Emergency Department
Admission: EM | Admit: 2017-02-25 | Discharge: 2017-02-25 | Disposition: A | Discharge status: Home / Self Care | Payer: BLUE CROSS/BLUE SHIELD | Attending: Emergency Medicine | Admitting: Emergency Medicine

## 2017-02-25 ENCOUNTER — Encounter: Payer: Self-pay | Admitting: Emergency Medicine

## 2017-02-25 DIAGNOSIS — R0981 Nasal congestion: Secondary | ICD-10-CM

## 2017-02-25 DIAGNOSIS — Z79899 Other long term (current) drug therapy: Secondary | ICD-10-CM | POA: Insufficient documentation

## 2017-02-25 DIAGNOSIS — J029 Acute pharyngitis, unspecified: Secondary | ICD-10-CM | POA: Insufficient documentation

## 2017-02-25 LAB — POCT RAPID STREP A: Streptococcus, Group A Screen (Direct): NEGATIVE

## 2017-02-25 MED ORDER — AMOXICILLIN-POT CLAVULANATE 875-125 MG PO TABS: 1.0000 | ORAL_TABLET | Freq: Two times a day (BID) | ORAL | 0 refills | Status: AC

## 2017-02-25 MED ORDER — FEXOFENADINE-PSEUDOEPHED ER 60-120 MG PO TB12: 1.0000 | ORAL_TABLET | Freq: Two times a day (BID) | ORAL | 0 refills | Status: DC

## 2017-02-25 NOTE — ED Notes (Signed)
Pt c/o sore throat and runny nose since yesterday. Denies any fevers at this time.

## 2017-02-25 NOTE — ED Provider Notes (Signed)
[facility] Emergency Department Provider Note   ____________________________________________   First [facility] 02/25/17 [phone]     (approximate)  I have reviewed the triage vital signs and the nursing notes.   HISTORY  Chief Complaint Sore Throat    HPI Autumn Stanley is a 21 y.o. female patient complaining of nasal congestion, right ear pressure, and sore throat. Patient state has a history of recurrent strep pharyngitis. Strep pharyngitis 2 weeks ago. Treated with amoxicillin. Patient rates her pain discomfort as 8/10. No palliative measures for complaint. Patient denies fever associated with this complaint.  Past Medical History:  Diagnosis Date  . Frequent headaches   . Kidney stone   . Migraine     Patient Active Problem List   Diagnosis Date Noted  . Anxiety and depression 05/26/2016  . Irregular periods 05/26/2016  . Frequent headaches 07/19/2015    Past Surgical History:  Procedure Laterality Date  . MOUTH SURGERY  2016    Prior to Admission medications   Medication Sig Start Date End Date Taking? Authorizing Provider  amoxicillin-clavulanate (AUGMENTIN) 875-125 MG tablet Take 1 tablet by mouth 2 (two) times daily. 02/25/17 03/07/17  Joni ReiningSmith, Ronald K, PA-C  fexofenadine-pseudoephedrine (ALLEGRA-D) 60-120 MG 12 hr tablet Take 1 tablet by mouth 2 (two) times daily. 02/25/17   Joni ReiningSmith, Ronald K, PA-C  HYDROcodone-homatropine [facility](HYCODAN) 5-1.5 MG/5ML syrup Take 5 mLs by mouth every 8 (eight) hours as needed for cough. 07/16/16   Lorre MunroeBaity, Regina W, NP  methocarbamol (ROBAXIN) 500 MG tablet Take 1 tablet (500 mg total) by mouth every 6 (six) hours as needed for muscle spasms. 07/08/16   Doreene Nestlark, Katherine K, NP  MONONESSA 0.25-35 MG-MCG tablet TAKE 1 TABLET BY MOUTH DAILY 07/24/16   Doreene Nestlark, Katherine K, NP  sulfamethoxazole-trimethoprim (BACTRIM DS,SEPTRA DS) 800-160 MG tablet Take 1 tablet by mouth every 12 (twelve) hours.  10/23/16    [provider]  SUMAtriptan (IMITREX) 25 MG tablet Take 1 tablet at migraine onset. May repeat in 2 hours if headache persists or recurs. Do not exceed 2 tablets in 24 hours. 08/13/16   Doreene Nestlark, Katherine K, NP  topiramate (TOPAMAX) 50 MG tablet TAKE 1 TABLET(50 MG) BY MOUTH AT BEDTIME 02/16/17   Doreene Nestlark, Katherine K, NP    Allergies Patient has no known allergies.  Family History  Problem Relation Age of Onset  . Arthritis Mother   . Breast cancer Mother   . Arthritis Maternal Grandmother   . Heart disease Maternal Grandmother   . Hyperlipidemia Maternal Grandmother   . Prostate cancer Maternal Grandfather   . Hyperlipidemia Maternal Grandfather   . Hyperlipidemia Paternal Grandmother   . Hyperlipidemia Paternal Grandfather     Social History Social History  Substance Use Topics  . Smoking status: Never Smoker  . Smokeless tobacco: Never Used  . Alcohol use No    Review of Systems  Constitutional: No fever/chills Eyes: No visual changes. ENT: No sore throat. Cardiovascular: Denies chest pain. Respiratory: Denies shortness of breath. Gastrointestinal: No abdominal pain.  No nausea, no vomiting.  No diarrhea.  No constipation. Genitourinary: Negative for dysuria. Musculoskeletal: Negative for back pain. Skin: Negative for rash. Neurological: Frequent headaches, but denies focal weakness or numbness. Psychiatric:Anxiety and depression.  PHYSICAL EXAM:  VITAL SIGNS: ED Triage Vitals  Enc Vitals Group     BP 02/25/17 0910 120/61     Pulse Rate 02/25/17 0910 (!) 108     Resp 02/25/17 0910 18  Temp 02/25/17 0910 98.9 F (37.2 C)     Temp Source 02/25/17 0910 Oral     SpO2 02/25/17 0910 99 %     Weight 02/25/17 0909 230 lb (104.3 kg)     Height 02/25/17 0909 5\' 9"  (1.753 m)     Head Circumference --      Peak Flow --      Pain Score 02/25/17 0909 8     Pain Loc --      Pain Edu? --      Excl. in GC? --     Constitutional: Alert and oriented. Well  appearing and in no acute distress. Nose:Right maxillary guarding with bilateral edematous nasal turbinates.  Mouth/Throat: Mucous membranes are moist.  Oropharynx non-erythematous. Edematous but not exudative tonsils. Postnasal drainage. Neck: No stridor.   Cardiovascular: Normal rate, regular rhythm. Grossly normal heart sounds.  Good peripheral circulation. Respiratory: Normal respiratory effort.  No retractions. Lungs CTAB. Neurologic:  Normal speech and language. No gross focal neurologic deficits are appreciated. No gait instability. Skin:  Skin is warm, dry and intact. No rash noted. Psychiatric: Mood and affect are normal. Speech and behavior are normal.  ____________________________________________   LABS (all labs ordered are listed, but only abnormal results are displayed)  Labs Reviewed  CULTURE, GROUP A STREP [facility]  POCT RAPID STREP A   ____________________________________________  EKG   ____________________________________________  RADIOLOGY  No results found.  ____________________________________________   PROCEDURES  Procedure(s) performed: None  Procedures  Critical Care performed: No  ____________________________________________   INITIAL IMPRESSION / ASSESSMENT AND PLAN / ED COURSE  Pertinent labs & imaging results that were available during my care of the patient were reviewed by me and considered in my medical decision making (see chart for details).  Pharyngitis and sinusitis. Discussed negative rapid strep test with palpation but advised culture is pending. Patient given discharge care instructions and advised follow-up PCP if condition persists.      ____________________________________________   FINAL CLINICAL IMPRESSION(S) / ED DIAGNOSES  Final diagnoses:  Pharyngitis, unspecified etiology  Congestion of nasal sinus      NEW MEDICATIONS STARTED DURING THIS VISIT:  New Prescriptions   AMOXICILLIN-CLAVULANATE (AUGMENTIN)  875-125 MG TABLET    Take 1 tablet by mouth 2 (two) times daily.   FEXOFENADINE-PSEUDOEPHEDRINE (ALLEGRA-D) 60-120 MG 12 HR TABLET    Take 1 tablet by mouth 2 (two) times daily.     Note:  This document was prepared using Dragon voice recognition software and may include unintentional dictation errors.    Joni ReiningSmith, Ronald K, PA-C 02/25/17 0945    Merrily Brittleifenbark, Neil, MD 02/25/17 [phone]

## 2017-02-25 NOTE — ED Notes (Signed)
Lab notified to add culture for strep swab [facility]

## 2017-02-25 NOTE — ED Triage Notes (Signed)
Pt reports sore throat and runny nose that began yesterday. Pt reports recently had strep throat.

## 2017-02-27 LAB — CULTURE, GROUP A STREP (THRC)

## 2017-03-23 ENCOUNTER — Encounter: Payer: Self-pay | Admitting: Primary Care

## 2017-03-23 DIAGNOSIS — R51 Headache: Principal | ICD-10-CM

## 2017-03-23 DIAGNOSIS — R519 Headache, unspecified: Secondary | ICD-10-CM

## 2017-03-24 MED ORDER — TOPIRAMATE 50 MG PO TABS: ORAL_TABLET | ORAL | 1 refills | Status: DC

## 2017-04-09 ENCOUNTER — Encounter: Payer: Self-pay | Admitting: Obstetrics and Gynecology

## 2017-04-21 ENCOUNTER — Ambulatory Visit (INDEPENDENT_AMBULATORY_CARE_PROVIDER_SITE_OTHER): Payer: BLUE CROSS/BLUE SHIELD | Admitting: Obstetrics and Gynecology

## 2017-04-21 ENCOUNTER — Encounter: Payer: Self-pay | Admitting: Obstetrics and Gynecology

## 2017-04-21 VITALS — BP 136/90 | HR 110 | Ht 69.0 in | Wt 269.0 lb

## 2017-04-21 DIAGNOSIS — Z01419 Encounter for gynecological examination (general) (routine) without abnormal findings: Secondary | ICD-10-CM | POA: Diagnosis not present

## 2017-04-21 DIAGNOSIS — E669 Obesity, unspecified: Secondary | ICD-10-CM

## 2017-04-21 DIAGNOSIS — Z124 Encounter for screening for malignant neoplasm of cervix: Secondary | ICD-10-CM | POA: Diagnosis not present

## 2017-04-21 DIAGNOSIS — L689 Hypertrichosis, unspecified: Secondary | ICD-10-CM | POA: Diagnosis not present

## 2017-04-21 MED ORDER — EFLORNITHINE HCL 13.9 % EX CREA: 1.0000 | TOPICAL_CREAM | Freq: Two times a day (BID) | CUTANEOUS | 3 refills | Status: DC

## 2017-04-21 NOTE — Progress Notes (Deleted)
GYNECOLOGY ANNUAL PHYSICAL EXAM PROGRESS NOTE  Subjective:    Autumn Stanley is a 21 y.o. G0P0000 female who presents for an annual exam.  The patient is sexually active.  The patient wears seatbelts: yes. The patient participates in regular exercise: no. Has the patient ever been transfused or tattooed?: no. The patient reports that there is not domestic violence in her life.    The patient has the complaints today:  1. Is concerned regarding her weight.  Notes that she would like to discuss initiating Phentermine.  Has been on this in the past with good results.  Notes that she had to discontinue it at the time as it was interfering with one of her medications she was on in the past, but is no longer taking.  Has been tried on several different weight loss medications since then, but none have given her very good results.   2.  Excessive hair growth.  Patient notes that she is having to shave her face daily, and shave the rest of her body at least twice weekly.  Notes at one time something being mentioned to her about possibly having PCOS, but was never worked up for it.   Gynecologic History Patient's last menstrual period was 04/15/2017. Menarche age: 35 Period Cycle (Days): 15 Period Duration (Days): 3-4 Period Pattern: Regular Menstrual Flow: Light Dysmenorrhea: (!) Moderate Dysmenorrhea Symptoms: Cramping  Contraception: OCP (estrogen/progesterone) History of STI's: Denies Last Pap: never had one.    Obstetric History   G0   P0   T0   P0   A0   L0    SAB0   TAB0   Ectopic0   Multiple0   Live Births0       Past Medical History:  Diagnosis Date  . Frequent headaches   . Kidney stone   . Migraine     Past Surgical History:  Procedure Laterality Date  . MOUTH SURGERY  2016    Family History  Problem Relation Age of Onset  . Arthritis Mother   . Breast cancer Mother 70's  . Arthritis Maternal Grandmother   . Heart disease Maternal Grandmother   .  Hyperlipidemia Maternal Grandmother   . Breast cancer Maternal Grandmother 70's  . Prostate cancer Maternal Grandfather   . Hyperlipidemia Maternal Grandfather   . Hyperlipidemia Paternal Grandmother   . Hyperlipidemia Paternal Grandfather   . Hypertension Father     Social History   Social History  . Marital status: Single    Spouse name: N/A  . Number of children: N/A  . Years of education: N/A   Occupational History  . daycare worker    Social History Main Topics  . Smoking status: Never Smoker  . Smokeless tobacco: Never Used  . Alcohol use Yes     Comment: Socially   . Drug use: No  . Sexual activity: Yes    Birth control/ protection: Pill   Other Topics Concern  . Not on file   Social History Narrative  . No narrative on file    Current Outpatient Prescriptions on File Prior to Visit  Medication Sig Dispense Refill  . SUMAtriptan (IMITREX) 25 MG tablet Take 1 tablet at migraine onset. May repeat in 2 hours if headache persists or recurs. Do not exceed 2 tablets in 24 hours. 10 tablet 0  . topiramate (TOPAMAX) 50 MG tablet TAKE 1 TABLET(50 MG) BY MOUTH AT BEDTIME 30 tablet 1   No current facility-administered medications on file prior to  visit.     No Known Allergies   Review of Systems Constitutional: negative for chills, fatigue, fevers and sweats Eyes: negative for irritation, redness and visual disturbance Ears, nose, mouth, throat, and face: negative for hearing loss, nasal congestion, snoring and tinnitus Respiratory: negative for asthma, cough, sputum Cardiovascular: negative for chest pain, dyspnea, exertional chest pressure/discomfort, irregular heart beat, palpitations and syncope Gastrointestinal: negative for abdominal pain, change in bowel habits, nausea and vomiting Genitourinary: negative for abnormal menstrual periods, genital lesions, sexual problems and vaginal discharge, dysuria and urinary incontinence Integument/breast: negative for  breast lump, breast tenderness and nipple discharge Hematologic/lymphatic: negative for bleeding and easy bruising Musculoskeletal:negative for back pain and muscle weakness Neurological: negative for dizziness, headaches, vertigo and weakness Endocrine: negative for diabetic symptoms including polydipsia, polyuria and skin dryness Allergic/Immunologic: negative for hay fever and urticaria        Objective:  Blood pressure 136/90, pulse (!) 110, height  (1.753 m), weight 269 lb (122 kg), last menstrual period 04/15/2017. Body mass index is 39.72 kg/m.  General Appearance:    Alert, cooperative, no distress, appears stated age, moderate obesity  Head:    Normocephalic, without obvious abnormality, atraumatic  Eyes:    PERRL, conjunctiva/corneas clear, EOM's intact, both eyes  Ears:    Normal external ear canals, both ears  Nose:   Nares normal, septum midline, mucosa normal, no drainage or sinus tenderness  Throat:   Lips, mucosa, and tongue normal; teeth and gums normal  Neck:   Supple, symmetrical, trachea midline, no adenopathy; thyroid: no enlargement/tenderness/nodules; no carotid bruit or JVD  Back:     Symmetric, no curvature, ROM normal, no CVA tenderness  Lungs:     Clear to auscultation bilaterally, respirations unlabored  Chest Wall:    No tenderness or deformity   Heart:    Regular rate and rhythm, S1 and S2 normal, no murmur, rub or gallop  Breast Exam:    No tenderness, masses, or nipple abnormality  Abdomen:     Soft, non-tender, bowel sounds active all four quadrants, no masses, no organomegaly.    Genitalia:    Pelvic:external genitalia normal, vagina without lesions, discharge, or tenderness, rectovaginal septum  normal. Cervix normal in appearance, no cervical motion tenderness, no adnexal masses or tenderness.  Uterus normal size, shape, mobile, regular contours, nontender.  Rectal:    Normal external sphincter.  No hemorrhoids appreciated. Internal exam not done.     Extremities:   Extremities normal, atraumatic, no cyanosis or edema  Pulses:   2+ and symmetric all extremities  Skin:   Skin color, texture, turgor normal, no rashes or lesions  Lymph nodes:   Cervical, supraclavicular, and axillary nodes normal  Neurologic:   CNII-XII intact, normal strength, sensation and reflexes throughout   .  Labs:  Lab Results  Component Value Date   WBC 10.6 (H) 07/08/2016   HGB 12.9 07/08/2016   HCT 38.2 07/08/2016   MCV 86.1 07/08/2016   PLT 376.0 07/08/2016    Lab Results  Component Value Date   CREATININE 0.79 07/08/2016   BUN 11 07/08/2016   NA 139 07/08/2016   K 4.3 07/08/2016   CL 106 07/08/2016   CO2 27 07/08/2016    Lab Results  Component Value Date   ALT 25 07/08/2016   AST 16 07/08/2016   ALKPHOS 68 07/08/2016   BILITOT 0.4 07/08/2016    No results found for: TSH   Assessment:   Routine gynecologic exam.  Obesity  Class II Excessive hair growth   Plan:     Blood tests: TSH and PCOS labs ordered.  Patient has an appointment with her PCP within the next 1-2 months. Will have annual labs drawn then. Breast self exam technique reviewed and patient encouraged to perform self-exam monthly. Contraception: OCP (estrogen/progesterone). Discussed that patient may want to consider changing OCP brand to one containing more of an anti-androgenic effect.  Patient is happy with current OCP but will consider. Discussed healthy lifestyle modifications. Pap smear performed today.   STD testing with GC/Cl performed with pap smear.  Discussed HPV vaccine, handout given.  Discussed treatment options for excessive hair growth, including changing OCP brand, Spironolactone, Vaniqa facial treatment, and electrolysis.  Patient will try Vaniqa.  States that she doesn't mind shaving legs and arms twice weekly but is tired of daily face shaving. Prescription given.  Also discussed possibility of PCOS diagnosis based on h/o excessive hair growth, obesity,  and h/o irregular periods (now regulated with OCPs) Will refer to Harlow Mares, CNM for weight loss management.  To f/u in 1 year for annual exam, and sooner for discussion of PCOS lab results once drawn.     Hildred Laser, MD [facility]

## 2017-04-21 NOTE — Progress Notes (Deleted)
GYNECOLOGY ANNUAL PHYSICAL EXAM PROGRESS NOTE  Subjective:    Autumn Stanley is a 21 y.o. G0P0000 female who presents for an annual exam. The patient has no complaints today. The patient is sexually active. GYN screening history: no prior history of gyn screening tests. The patient wears seatbelts: {yes/no:311178}. The patient participates in regular exercise: {yes/no/not asked:9010}. Has the patient ever been transfused or tattooed?: {yes/no/not asked:9010}. The patient reports that there {is/is not:9024} domestic violence in her life.    Gynecologic History Patient's last menstrual period was 04/15/2017. Menstrual History: OB History    Gravida Para Term Preterm AB Living   0 0 0 0 0 0   SAB TAB Ectopic Multiple Live Births   0 0 0 0 0      Menarche age: *** Patient's last menstrual period was 04/15/2017. Period Cycle (Days): 31 Period Duration (Days): 3-4 Period Pattern: Regular Menstrual Flow: Light Dysmenorrhea: (!) Moderate Dysmenorrhea Symptoms: Cramping  Contraception: {method:5051} History of STI's:  Last Pap: ***. Results were: {norm/abn:16337}.  ***Denies/Notes h/o abnormal pap smears. Last mammogram: ***. Results were: {norm/abn:16337}   Obstetric History   G0   P0   T0   P0   A0   L0    SAB0   TAB0   Ectopic0   Multiple0   Live Births0       Past Medical History:  Diagnosis Date  . Frequent headaches   . Kidney stone   . Migraine     Past Surgical History:  Procedure Laterality Date  . MOUTH SURGERY  2016    Family History  Problem Relation Age of Onset  . Arthritis Mother   . Breast cancer Mother   . Arthritis Maternal Grandmother   . Heart disease Maternal Grandmother   . Hyperlipidemia Maternal Grandmother   . Breast cancer Maternal Grandmother   . Prostate cancer Maternal Grandfather   . Hyperlipidemia Maternal Grandfather   . Hyperlipidemia Paternal Grandmother   . Hyperlipidemia Paternal Grandfather   . Hypertension Father      Social History   Social History  . Marital status: Single    Spouse name: N/A  . Number of children: N/A  . Years of education: N/A   Occupational History  . daycare worker    Social History Main Topics  . Smoking status: Never Smoker  . Smokeless tobacco: Never Used  . Alcohol use Yes     Comment: Socially   . Drug use: No  . Sexual activity: Yes    Birth control/ protection: Pill   Other Topics Concern  . Not on file   Social History Narrative  . No narrative on file    Current Outpatient Prescriptions on File Prior to Visit  Medication Sig Dispense Refill  . SUMAtriptan (IMITREX) 25 MG tablet Take 1 tablet at migraine onset. May repeat in 2 hours if headache persists or recurs. Do not exceed 2 tablets in 24 hours. 10 tablet 0  . topiramate (TOPAMAX) 50 MG tablet TAKE 1 TABLET(50 MG) BY MOUTH AT BEDTIME 30 tablet 1   No current facility-administered medications on file prior to visit.     No Known Allergies    Review of Systems Constitutional: negative for chills, fatigue, fevers and sweats Eyes: negative for irritation, redness and visual disturbance Ears, nose, mouth, throat, and face: negative for hearing loss, nasal congestion, snoring and tinnitus Respiratory: negative for asthma, cough, sputum Cardiovascular: negative for chest pain, dyspnea, exertional chest pressure/discomfort, irregular heart beat, palpitations  and syncope Gastrointestinal: negative for abdominal pain, change in bowel habits, nausea and vomiting Genitourinary: negative for abnormal menstrual periods, genital lesions, sexual problems and vaginal discharge, dysuria and urinary incontinence Integument/breast: negative for breast lump, breast tenderness and nipple discharge Hematologic/lymphatic: negative for bleeding and easy bruising Musculoskeletal:negative for back pain and muscle weakness Neurological: negative for dizziness, headaches, vertigo and weakness Endocrine: negative for  diabetic symptoms including polydipsia, polyuria and skin dryness Allergic/Immunologic: negative for hay fever and urticaria        Objective:  Blood pressure 136/90, pulse (!) 110, height  (1.753 m), weight 269 lb (122 kg), last menstrual period 04/15/2017. Body mass index is 39.72 kg/m.     General Appearance:    Alert, cooperative, no distress, appears stated age  Head:    Normocephalic, without obvious abnormality, atraumatic  Eyes:    PERRL, conjunctiva/corneas clear, EOM's intact, both eyes  Ears:    Normal external ear canals, both ears  Nose:   Nares normal, septum midline, mucosa normal, no drainage or sinus tenderness  Throat:   Lips, mucosa, and tongue normal; teeth and gums normal  Neck:   Supple, symmetrical, trachea midline, no adenopathy; thyroid: no enlargement/tenderness/nodules; no carotid bruit or JVD  Back:     Symmetric, no curvature, ROM normal, no CVA tenderness  Lungs:     Clear to auscultation bilaterally, respirations unlabored  Chest Wall:    No tenderness or deformity   Heart:    Regular rate and rhythm, S1 and S2 normal, no murmur, rub or gallop  Breast Exam:    No tenderness, masses, or nipple abnormality  Abdomen:     Soft, non-tender, bowel sounds active all four quadrants, no masses, no organomegaly.    Genitalia:    Pelvic:external genitalia normal, vagina without lesions, discharge, or tenderness, rectovaginal septum  normal. Cervix normal in appearance, no cervical motion tenderness, no adnexal masses or tenderness.  Uterus normal size, shape, mobile, regular contours, nontender.  Rectal:    Normal external sphincter.  No hemorrhoids appreciated. Internal exam not done.   Extremities:   Extremities normal, atraumatic, no cyanosis or edema  Pulses:   2+ and symmetric all extremities  Skin:   Skin color, texture, turgor normal, no rashes or lesions  Lymph nodes:   Cervical, supraclavicular, and axillary nodes normal  Neurologic:   CNII-XII intact,  normal strength, sensation and reflexes throughout   .  Labs:  Lab Results  Component Value Date   WBC 10.6 (H) 07/08/2016   HGB 12.9 07/08/2016   HCT 38.2 07/08/2016   MCV 86.1 07/08/2016   PLT 376.0 07/08/2016    Lab Results  Component Value Date   CREATININE 0.79 07/08/2016   BUN 11 07/08/2016   NA 139 07/08/2016   K 4.3 07/08/2016   CL 106 07/08/2016   CO2 27 07/08/2016    Lab Results  Component Value Date   ALT 25 07/08/2016   AST 16 07/08/2016   ALKPHOS 68 07/08/2016   BILITOT 0.4 07/08/2016    No results found for: TSH   Assessment:    Healthy female exam.    Plan:     {gyn plan:13146}     Debbe Bales, CMA [facility] .

## 2017-04-21 NOTE — Patient Instructions (Signed)

## 2017-04-22 ENCOUNTER — Ambulatory Visit (INDEPENDENT_AMBULATORY_CARE_PROVIDER_SITE_OTHER): Payer: BLUE CROSS/BLUE SHIELD | Admitting: Obstetrics and Gynecology

## 2017-04-22 ENCOUNTER — Encounter: Payer: Self-pay | Admitting: Obstetrics and Gynecology

## 2017-04-22 ENCOUNTER — Ambulatory Visit: Payer: BLUE CROSS/BLUE SHIELD | Admitting: Primary Care

## 2017-04-22 VITALS — BP 118/84 | HR 101 | Ht 69.0 in | Wt 269.0 lb

## 2017-04-22 DIAGNOSIS — E669 Obesity, unspecified: Secondary | ICD-10-CM

## 2017-04-22 MED ORDER — EFLORNITHINE HCL 13.9 % EX CREA: 1.0000 | TOPICAL_CREAM | Freq: Two times a day (BID) | CUTANEOUS | 3 refills | Status: DC

## 2017-04-22 MED ORDER — PHENTERMINE HCL 37.5 MG PO TABS: 37.5000 mg | ORAL_TABLET | Freq: Every day | ORAL | 2 refills | Status: DC

## 2017-04-22 MED ORDER — CYANOCOBALAMIN 1000 MCG/ML IJ SOLN: 1000.0000 ug | INTRAMUSCULAR | 1 refills | Status: DC

## 2017-04-22 NOTE — Progress Notes (Signed)
Subjective:  Brandace Cargle is a 21 y.o. G0P0000 at Unknown being seen today for weight loss management- initial visit.  Patient reports General ROS: negative and reports previous weight loss attempts: bariatric clinic- worked well, but medication got to be too expensive.   Associated symptoms include: fatigue, depression, anxiety, greasy stools, abdominal pain, polydipsia, headaches, change in clothing fit and menstrual changes. 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   The following portions of the patient's history were reviewed and updated as appropriate: allergies, current medications, past family history, past medical history, past social history, past surgical history and problem list.   Objective:   Vitals:   04/22/17 1104  BP: 118/84  Pulse: (!) 101  Weight: 269 lb (122 kg)  Height:  (1.753 m)    General:  Alert, oriented and cooperative. Patient is in no acute distress.  :   :   :   :   :   :   PE: Well groomed female in no current distress,   Mental Status: Normal mood and affect. Normal behavior. Normal judgment and thought content.   Current BMI: Body mass index is 39.72 kg/m.   Assessment and Plan:  Obesity  There are no diagnoses linked to this encounter.  Plan: low carb, High protein diet RX for adipex 37.5 mg daily and B12 .ml monthly, to start now with first injection given at today's visit. Reviewed side-effects common to both medications and expected outcomes. Increase daily water intake to at least 8 bottle a day, every day.  Goal is to reduse weight by 10% by end of three months, and will re-evaluate then.  RTC in 4 weeks for Nurse visit to check weight & BP, and get next B12 injections.    Please refer to After Visit Summary for other counseling recommendations.    Haskins,  Melody N, CNM   Melody Cameron, CNM      Consider the Low Glycemic Index Diet and 6 smaller meals daily .  This boosts your metabolism and regulates your sugars:   Use the protein bar by Atkins because they have lots of fiber in them  Find the low carb flatbreads, tortillas and pita breads for sandwiches:  Joseph's makes a pita bread and a flat bread , available at [facility] and [employer]; Toufayah makes a low carb flatbread available at [employer] and HT that is 9 net carbs and 100 cal [place] makes a low carb whole wheat tortilla available at [employer] most grocery stores with 6 net carbs and 210 cal  [place] yogurt can still have a lot of carbs .  Dannon Light N fit has 80 cal and 8 carbs

## 2017-04-22 NOTE — Patient Instructions (Addendum)
Plan: low carb, High protein diet RX for adipex 37.5 mg daily and B12 .ml monthly, to start now with first injection given at today's visit. Reviewed side-effects common to both medications and expected outcomes. Increase daily water intake to at least 8 bottle a day, every day.  Goal is to reduse weight by 10% by end of three months, and will re-evaluate then.  Consider the Low Glycemic Index Diet and 6 smaller meals daily .  This boosts your metabolism and regulates your sugars:   Use the protein bar by Atkins because they have lots of fiber in them  Find the low carb flatbreads, tortillas and pita breads for sandwiches:  Joseph's makes a pita bread and a flat bread , available at [facility] and [employer]; Toufayah makes a low carb flatbread available at [employer] and [employer] that is 9 net carbs and 100 cal [place] makes a low carb whole wheat tortilla available at [employer] most grocery stores with 6 net carbs and 210 cal  [place] yogurt can still have a lot of carbs .  Dannon Light N fit has 80 cal and 8 carbs

## 2017-04-23 ENCOUNTER — Encounter: Payer: Self-pay | Admitting: Obstetrics and Gynecology

## 2017-04-23 LAB — PAP IG, CT-NG, RFX HPV ASCU
Chlamydia, Nuc. Acid Amp: NEGATIVE
Gonococcus by Nucleic Acid Amp: NEGATIVE
PAP SMEAR COMMENT: 0

## 2017-04-23 NOTE — Progress Notes (Signed)
GYNECOLOGY ANNUAL PHYSICAL EXAM PROGRESS NOTE  Subjective:    Autumn Stanley is a 21 y.o. G0P0000 female who presents for an annual exam.  The patient is sexually active.  The patient wears seatbelts: yes. The patient participates in regular exercise: no. Has the patient ever been transfused or tattooed?: no. The patient reports that there is not domestic violence in her life.    The patient has the complaints today:  1. Is concerned regarding her weight.  Notes that she would like to discuss initiating Phentermine.  Has been on this in the past with good results.  Notes that she had to discontinue it at the time as it was interfering with one of her medications she was on in the past, but is no longer taking.  Has been tried on several different weight loss medications since then, but none have given her very good results.   2.  Excessive hair growth.  Patient notes that she is having to shave her face daily, and shave the rest of her body at least twice weekly.  Notes at one time something being mentioned to her about possibly having PCOS, but was never worked up for it.   Gynecologic History Patient's last menstrual period was 04/15/2017. Menarche age: 70 Period Cycle (Days): 38 Period Duration (Days): 3-4 Period Pattern: Regular Menstrual Flow: Light Dysmenorrhea: (!) Moderate Dysmenorrhea Symptoms: Cramping  Contraception: OCP (estrogen/progesterone) History of STI's: Denies Last Pap: never had one.    Obstetric History   G0   P0   T0   P0   A0   L0    SAB0   TAB0   Ectopic0   Multiple0   Live Births0           Past Medical History:  Diagnosis Date  . Frequent headaches   . Kidney stone   . Migraine          Past Surgical History:  Procedure Laterality Date  . MOUTH SURGERY  2016         Family History  Problem Relation Age of Onset  . Arthritis Mother   . Breast cancer Mother 79's  . Arthritis Maternal Grandmother   . Heart  disease Maternal Grandmother   . Hyperlipidemia Maternal Grandmother   . Breast cancer Maternal Grandmother 70's  . Prostate cancer Maternal Grandfather   . Hyperlipidemia Maternal Grandfather   . Hyperlipidemia Paternal Grandmother   . Hyperlipidemia Paternal Grandfather   . Hypertension Father     Social History        Social History  . Marital status: Single    Spouse name: N/A  . Number of children: N/A  . Years of education: N/A       Occupational History  . daycare worker          Social History Main Topics  . Smoking status: Never Smoker  . Smokeless tobacco: Never Used  . Alcohol use Yes     Comment: Socially   . Drug use: No  . Sexual activity: Yes    Birth control/ protection: Pill       Other Topics Concern  . Not on file      Social History Narrative  . No narrative on file          Current Outpatient Prescriptions on File Prior to Visit  Medication Sig Dispense Refill  . SUMAtriptan (IMITREX) 25 MG tablet Take 1 tablet at migraine onset. May repeat in 2 hours if  headache persists or recurs. Do not exceed 2 tablets in 24 hours. 10 tablet 0  . topiramate (TOPAMAX) 50 MG tablet TAKE 1 TABLET(50 MG) BY MOUTH AT BEDTIME 30 tablet 1   No current facility-administered medications on file prior to visit.     No Known Allergies   Review of Systems Constitutional: negative for chills, fatigue, fevers and sweats Eyes: negative for irritation, redness and visual disturbance Ears, nose, mouth, throat, and face: negative for hearing loss, nasal congestion, snoring and tinnitus Respiratory: negative for asthma, cough, sputum Cardiovascular: negative for chest pain, dyspnea, exertional chest pressure/discomfort, irregular heart beat, palpitations and syncope Gastrointestinal: negative for abdominal pain, change in bowel habits, nausea and vomiting Genitourinary: negative for abnormal menstrual periods, genital lesions, sexual  problems and vaginal discharge, dysuria and urinary incontinence Integument/breast: negative for breast lump, breast tenderness and nipple discharge Hematologic/lymphatic: negative for bleeding and easy bruising Musculoskeletal:negative for back pain and muscle weakness Neurological: negative for dizziness, headaches, vertigo and weakness Endocrine: negative for diabetic symptoms including polydipsia, polyuria and skin dryness Allergic/Immunologic: negative for hay fever and urticaria        Objective:  Blood pressure 136/90, pulse (!) 110, height  (1.753 m), weight 269 lb (122 kg), last menstrual period 04/15/2017. Body mass index is 39.72 kg/m.  General Appearance:    Alert, cooperative, no distress, appears stated age, moderate obesity  Head:    Normocephalic, without obvious abnormality, atraumatic  Eyes:    PERRL, conjunctiva/corneas clear, EOM's intact, both eyes  Ears:    Normal external ear canals, both ears  Nose:   Nares normal, septum midline, mucosa normal, no drainage or sinus tenderness  Throat:   Lips, mucosa, and tongue normal; teeth and gums normal  Neck:   Supple, symmetrical, trachea midline, no adenopathy; thyroid: no enlargement/tenderness/nodules; no carotid bruit or JVD  Back:     Symmetric, no curvature, ROM normal, no CVA tenderness  Lungs:     Clear to auscultation bilaterally, respirations unlabored  Chest Wall:    No tenderness or deformity   Heart:    Regular rate and rhythm, S1 and S2 normal, no murmur, rub or gallop  Breast Exam:    No tenderness, masses, or nipple abnormality  Abdomen:     Soft, non-tender, bowel sounds active all four quadrants, no masses, no organomegaly.    Genitalia:    Pelvic:external genitalia normal, vagina without lesions, discharge, or tenderness, rectovaginal septum  normal. Cervix normal in appearance, no cervical motion tenderness, no adnexal masses or tenderness.  Uterus normal size, shape, mobile, regular contours,  nontender.  Rectal:    Normal external sphincter.  No hemorrhoids appreciated. Internal exam not done.   Extremities:   Extremities normal, atraumatic, no cyanosis or edema  Pulses:   2+ and symmetric all extremities  Skin:   Skin color, texture, turgor normal, no rashes or lesions  Lymph nodes:   Cervical, supraclavicular, and axillary nodes normal  Neurologic:   CNII-XII intact, normal strength, sensation and reflexes throughout   .  Labs:  RecentLabs       Lab Results  Component Value Date   WBC 10.6 (H) 07/08/2016   HGB 12.9 07/08/2016   HCT 38.2 07/08/2016   MCV 86.1 07/08/2016   PLT 376.0 07/08/2016      RecentLabs  Lab Results  Component Value Date   CREATININE 0.79 07/08/2016   BUN 11 07/08/2016   NA 139 07/08/2016   K 4.3 07/08/2016   CL 106  07/08/2016   CO2 27 07/08/2016      RecentLabs       Lab Results  Component Value Date   ALT 25 07/08/2016   AST 16 07/08/2016   ALKPHOS 68 07/08/2016   BILITOT 0.4 07/08/2016      RecentLabs  No results found for: TSH     Assessment:   Routine gynecologic exam.  Obesity Class II Excessive hair growth   Plan:     Blood tests: TSH and PCOS labs ordered.  Patient has an appointment with her PCP within the next 1-2 months. Will have annual labs drawn then. Breast self exam technique reviewed and patient encouraged to perform self-exam monthly. Contraception: OCP (estrogen/progesterone). Discussed that patient may want to consider changing OCP brand to one containing more of an anti-androgenic effect.  Patient is happy with current OCP but will consider. Discussed healthy lifestyle modifications. Pap smear performed today.   STD testing with GC/Cl performed with pap smear.  Discussed HPV vaccine, handout given.  Discussed treatment options for excessive hair growth, including changing OCP brand, Spironolactone, Vaniqa facial treatment, and electrolysis.  Patient will try Vaniqa.   States that she doesn't mind shaving legs and arms twice weekly but is tired of daily face shaving. Prescription given.  Also discussed possibility of PCOS diagnosis based on h/o excessive hair growth, obesity, and h/o irregular periods (now regulated with OCPs) Will refer to Harlow Mares, CNM for weight loss management.  To f/u in 1 year for annual exam, and sooner for discussion of PCOS lab results once drawn.     Hildred Laser, MD [facility]

## 2017-04-24 ENCOUNTER — Other Ambulatory Visit: Payer: Self-pay | Admitting: Obstetrics and Gynecology

## 2017-04-24 MED ORDER — SPIRONOLACTONE 50 MG PO TABS: 50.0000 mg | ORAL_TABLET | Freq: Two times a day (BID) | ORAL | 2 refills | Status: DC

## 2017-04-28 ENCOUNTER — Encounter: Payer: Self-pay | Admitting: Primary Care

## 2017-04-28 ENCOUNTER — Ambulatory Visit (INDEPENDENT_AMBULATORY_CARE_PROVIDER_SITE_OTHER): Payer: BLUE CROSS/BLUE SHIELD | Admitting: Primary Care

## 2017-04-28 VITALS — BP 124/84 | HR 97 | Temp 98.0°F | Ht 69.0 in | Wt 266.0 lb

## 2017-04-28 DIAGNOSIS — J351 Hypertrophy of tonsils: Secondary | ICD-10-CM

## 2017-04-28 DIAGNOSIS — R519 Headache, unspecified: Secondary | ICD-10-CM

## 2017-04-28 DIAGNOSIS — R51 Headache: Secondary | ICD-10-CM

## 2017-04-28 LAB — COMPREHENSIVE METABOLIC PANEL
ALT: 15 U/L (ref 0–35)
AST: 16 U/L (ref 0–37)
Albumin: 4.1 g/dL (ref 3.5–5.2)
Alkaline Phosphatase: 61 U/L (ref 39–117)
BUN: 13 mg/dL (ref 6–23)
CHLORIDE: 103 meq/L (ref 96–112)
CO2: 26 mEq/L (ref 19–32)
Calcium: 9.5 mg/dL (ref 8.4–10.5)
Creatinine, Ser: 0.73 mg/dL (ref 0.40–1.20)
GFR: 105.67 mL/min (ref >60.00)
GLUCOSE: 87 mg/dL (ref 70–99)
POTASSIUM: 4.1 meq/L (ref 3.5–5.1)
SODIUM: 137 meq/L (ref 135–145)
Total Bilirubin: 0.6 mg/dL (ref 0.2–1.2)
Total Protein: 7.5 g/dL (ref 6.0–8.3)

## 2017-04-28 MED ORDER — TOPIRAMATE 50 MG PO TABS: ORAL_TABLET | ORAL | 3 refills | Status: DC

## 2017-04-28 NOTE — Assessment & Plan Note (Signed)
Doing well on Topamax, CMP pending. Refills provided today. No recent Imitrex use.

## 2017-04-28 NOTE — Progress Notes (Signed)
Subjective:    Patient ID: Autumn Stanley, female    DOB: Jul 16, 1996, 21 y.o.   MRN: [id number]  HPI  Ms. Autumn Stanley is a 21 year old female who presents today for medication refills.   She has a history of frequent headaches and is managed on Topamax 50 mg and Imitrex 25 mg. She's not had to use her Imitrex in several months. When managed on the Topamax she's experiencing 1-2 headaches weekly for which will improve with Excedrin. She denies chest pain, palpitations, visual changes.   2) Swollen Tonsils: History of recurrent strep and swollen tonsils for years. If she gets any sort of viral infection or allergy flare her tonsils will cause airway tightness to where she can't breathe. Also with difficulty swallowing. She was once referred to ENT for evaluation of tonsillectomy but she never went to the appointment. She is ready for evaluation for tonsillectomy now.   Review of Systems  HENT: Positive for trouble swallowing.   Eyes: Negative for visual disturbance.  Respiratory:       Difficulty with airway movement during viral and allergy bouts.  Cardiovascular: Negative for chest pain.  Neurological:       Headaches improved on Topamax       Past Medical History:  Diagnosis Date  . Frequent headaches   . Kidney stone   . Migraine      Social History   Social History  . Marital status: Single    Spouse name: N/A  . Number of children: N/A  . Years of education: N/A   Occupational History  . daycare worker    Social History Main Topics  . Smoking status: Never Smoker  . Smokeless tobacco: Never Used  . Alcohol use Yes     Comment: Socially   . Drug use: No  . Sexual activity: Yes    Birth control/ protection: Pill   Other Topics Concern  . Not on file   Social History Narrative  . No narrative on file    Past Surgical History:  Procedure Laterality Date  . MOUTH SURGERY  2016    Family History  Problem Relation Age of Onset  . Arthritis Mother   . Breast  cancer Mother   . Arthritis Maternal Grandmother   . Heart disease Maternal Grandmother   . Hyperlipidemia Maternal Grandmother   . Breast cancer Maternal Grandmother   . Prostate cancer Maternal Grandfather   . Hyperlipidemia Maternal Grandfather   . Hyperlipidemia Paternal Grandmother   . Hyperlipidemia Paternal Grandfather   . Hypertension Father     No Known Allergies  Current Outpatient Prescriptions on File Prior to Visit  Medication Sig Dispense Refill  . cyanocobalamin (,VITAMIN B-12,) 1000 MCG/ML injection Inject 1 mL (1,000 mcg total) into the muscle every 30 (thirty) days. 10 mL 1  . phentermine (ADIPEX-P) 37.5 MG tablet Take 1 tablet (37.5 mg total) by mouth daily before breakfast. 30 tablet 2  . SPRINTEC 28 0.25-35 MG-MCG tablet Take 1 tablet by mouth daily.  0  . spironolactone (ALDACTONE) 50 MG tablet Take 1 tablet (50 mg total) by mouth 2 (two) times daily. Start with 50 mg twice daily (Patient not taking: Reported on 04/28/2017) 60 tablet 2  . SUMAtriptan (IMITREX) 25 MG tablet Take 1 tablet at migraine onset. May repeat in 2 hours if headache persists or recurs. Do not exceed 2 tablets in 24 hours. (Patient not taking: Reported on 04/28/2017) 10 tablet 0   No current facility-administered medications  on file prior to visit.     BP 124/84   Pulse 97   Temp 98 F (36.7 C) (Oral)   Ht  (1.753 m)   Wt 266 lb (120.7 kg)   LMP 04/15/2017   SpO2 97%   BMI 39.28 kg/m    Objective:   Physical Exam  Constitutional: She appears well-nourished.  HENT:  Mouth/Throat: Posterior oropharyngeal erythema present.  Left tonsil 3+, right tonsil 2+  Neck: Neck supple.  Cardiovascular: Normal rate and regular rhythm.   Pulmonary/Chest: Effort normal and breath sounds normal.  Skin: Skin is warm and dry.          Assessment & Plan:  Enlarged Tonsils:  Present for years.  Exam today with enlarged tonsils without infection. Lungs clear.  Referral placed to ENT  for evaluation for tonsillectomy.   Morrie Sheldon, NP

## 2017-04-28 NOTE — Patient Instructions (Signed)
Complete lab work prior to leaving today. I will notify you of your results once received.   You will be contacted regarding your referral to ENT.  Please let us know if you have not heard back within one week.   It was a pleasure to see you today!

## 2017-05-17 ENCOUNTER — Ambulatory Visit (INDEPENDENT_AMBULATORY_CARE_PROVIDER_SITE_OTHER): Payer: BLUE CROSS/BLUE SHIELD | Admitting: Obstetrics and Gynecology

## 2017-05-17 VITALS — BP 116/77 | HR 101 | Wt 256.6 lb

## 2017-05-17 DIAGNOSIS — E669 Obesity, unspecified: Secondary | ICD-10-CM

## 2017-05-17 MED ORDER — CYANOCOBALAMIN 1000 MCG/ML IJ SOLN
1000.0000 ug | Freq: Once | INTRAMUSCULAR | Status: AC
  Administered 2017-05-17: 1000 ug via INTRAMUSCULAR

## 2017-05-17 NOTE — Progress Notes (Signed)
Pt presents for  Weight,B/P, B-12 injection. No side effects of medications-Phentermine or B-12. Weight loss __12.1__ lbs. Encourage eating healthy and exercise.

## 2017-06-14 ENCOUNTER — Encounter: Payer: Self-pay | Admitting: Obstetrics and Gynecology

## 2017-06-14 ENCOUNTER — Ambulatory Visit (INDEPENDENT_AMBULATORY_CARE_PROVIDER_SITE_OTHER): Payer: BLUE CROSS/BLUE SHIELD | Admitting: Obstetrics and Gynecology

## 2017-06-14 VITALS — BP 118/85 | HR 83 | Wt 251.0 lb

## 2017-06-14 DIAGNOSIS — E669 Obesity, unspecified: Secondary | ICD-10-CM | POA: Diagnosis not present

## 2017-06-14 MED ORDER — CYANOCOBALAMIN 1000 MCG/ML IJ SOLN
1000.0000 ug | Freq: Once | INTRAMUSCULAR | Status: AC
  Administered 2017-06-14: 1000 ug via INTRAMUSCULAR

## 2017-06-14 NOTE — Progress Notes (Signed)
Pt is here for wt, bp check, b-12 inj, denies any s/e   06/14/17 wt- 251lb 05/17/17 wt-256lb 04/22/17 wt-269lb

## 2017-07-09 ENCOUNTER — Other Ambulatory Visit: Payer: Self-pay | Admitting: Primary Care

## 2017-07-11 ENCOUNTER — Other Ambulatory Visit: Payer: Self-pay | Admitting: Primary Care

## 2017-07-12 ENCOUNTER — Ambulatory Visit: Payer: BLUE CROSS/BLUE SHIELD | Admitting: Obstetrics and Gynecology

## 2017-07-14 NOTE — Telephone Encounter (Signed)
Copied from CRM [id number]. Topic: Quick Communication - Rx Refill/Question >> Jul 14, 2017 12:23 PM Alexander BergeronBarksdale, Harvey B wrote: Pt called to check status of [facility] 0.25[facility]35 MG-MCG tablet, pharmacy states request was sent in and is awaiting approval, contact pharmacy if needed

## 2017-07-15 ENCOUNTER — Ambulatory Visit: Payer: BLUE CROSS/BLUE SHIELD | Admitting: Obstetrics and Gynecology

## 2017-07-15 ENCOUNTER — Other Ambulatory Visit: Payer: Self-pay | Admitting: *Deleted

## 2017-07-15 MED ORDER — SPRINTEC 28 0.25-35 MG-MCG PO TABS: 1.0000 | ORAL_TABLET | Freq: Every day | ORAL | 2 refills | Status: DC

## 2017-07-17 ENCOUNTER — Other Ambulatory Visit: Payer: Self-pay | Admitting: Primary Care

## 2017-07-17 DIAGNOSIS — R519 Headache, unspecified: Secondary | ICD-10-CM

## 2017-07-17 DIAGNOSIS — R51 Headache: Principal | ICD-10-CM

## 2017-07-19 MED ORDER — SUMATRIPTAN SUCCINATE 25 MG PO TABS: ORAL_TABLET | ORAL | 0 refills | Status: DC

## 2017-07-19 NOTE — Telephone Encounter (Signed)
GYN has already filled medication 07/15/17

## 2017-07-19 NOTE — Telephone Encounter (Signed)
Ok to refill? Electronically refill request for SUMAtriptan (IMITREX) 25 MG tablet  Last prescribed on 08/13/2016. Last seen on 04/28/2017

## 2017-08-16 ENCOUNTER — Ambulatory Visit: Payer: BLUE CROSS/BLUE SHIELD | Admitting: Obstetrics and Gynecology

## 2017-08-27 ENCOUNTER — Telehealth: Payer: Self-pay | Admitting: Obstetrics and Gynecology

## 2017-08-27 NOTE — Telephone Encounter (Signed)
The patient called and stated she would like to speak with Amy in regards to her (weight check, Bp, B12) appointment. The patient would like to clarify some of her concerns and questions. Please advise.

## 2017-08-30 ENCOUNTER — Ambulatory Visit: Payer: BLUE CROSS/BLUE SHIELD | Admitting: Obstetrics and Gynecology

## 2017-09-01 ENCOUNTER — Encounter: Payer: Self-pay | Admitting: *Deleted

## 2017-09-01 NOTE — Telephone Encounter (Signed)
Notified pt via mychart

## 2017-09-06 ENCOUNTER — Ambulatory Visit: Payer: BLUE CROSS/BLUE SHIELD | Admitting: Primary Care

## 2017-09-06 ENCOUNTER — Encounter: Payer: Self-pay | Admitting: Primary Care

## 2017-09-06 VITALS — BP 124/84 | HR 97 | Temp 98.0°F | Wt 254.2 lb

## 2017-09-06 DIAGNOSIS — J209 Acute bronchitis, unspecified: Secondary | ICD-10-CM

## 2017-09-06 MED ORDER — AZITHROMYCIN 250 MG PO TABS: ORAL_TABLET | ORAL | 0 refills | Status: DC

## 2017-09-06 MED ORDER — GUAIFENESIN-CODEINE 100-10 MG/5ML PO SYRP: 5.0000 mL | ORAL_SOLUTION | Freq: Three times a day (TID) | ORAL | 0 refills | Status: DC | PRN

## 2017-09-06 NOTE — Patient Instructions (Signed)
Start Azithromycin antibiotics for infection. Take 2 tablets by mouth today, then 1 tablet daily for 4 additional days.  You may take the cough suppressant every 8 hours as needed for cough and rest. Caution this medication contains codeine and will make you feel drowsy.  Ensure you are staying hydrated with water and rest.  It was a pleasure to see you today!

## 2017-09-06 NOTE — Progress Notes (Signed)
Subjective:    Patient ID: Autumn [facility], female    DOB: 07-15-1996, 22 y.o.   MRN: [id number]  HPI  Autumn Stanley is a 22 year old female who presents today with a chief complaint of cough. She also reports sore throat, chest congestion, headaches. Her symptoms began 2 weeks ago. 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  She's been taking Mucinex DM, Dayquil/Nyquil, and Theraflu without improvement. Her cough is persistent, she's not sleeping due to her cough. Overall she's about the same.  Review of Systems  Constitutional: Negative for chills and fever.  HENT: Positive for congestion, sinus pressure and sore throat.   Respiratory: Positive for cough. Negative for shortness of breath and wheezing.   Neurological: Positive for headaches.       Past Medical History:  Diagnosis Date  . Frequent headaches   . Kidney stone   . Migraine      Social History   Socioeconomic History  . Marital status: Single    Spouse name: Not on file  . Number of children: Not on file  . Years of education: Not on file  . Highest education level: Not on file  Social Needs  . Financial resource strain: Not on file  . Food insecurity - worry: Not on file  . Food insecurity - inability: Not on file  . Transportation needs - medical: Not on file  . Transportation needs - non-medical: Not on file  Occupational History  . Occupation: Administrator, sports  Tobacco Use  . Smoking status: Never Smoker  . Smokeless tobacco: Never Used  Substance and Sexual Activity  . Alcohol use: Yes    Comment: Socially   . Drug use: No  . Sexual activity: Yes    Birth control/protection: Pill  Other Topics Concern  . Not on file  Social History Narrative  . Not on file    Past Surgical History:  Procedure Laterality Date  . MOUTH SURGERY  2016    Family History  Problem Relation Age of Onset  . Arthritis Mother   . Breast cancer Mother   . Arthritis Maternal Grandmother   . Heart  disease Maternal Grandmother   . Hyperlipidemia Maternal Grandmother   . Breast cancer Maternal Grandmother   . Prostate cancer Maternal Grandfather   . Hyperlipidemia Maternal Grandfather   . Hyperlipidemia Paternal Grandmother   . Hyperlipidemia Paternal Grandfather   . Hypertension Father     No Known Allergies  Current Outpatient Medications on File Prior to Visit  Medication Sig Dispense Refill  . cyanocobalamin (,VITAMIN B-12,) 1000 MCG/ML injection Inject 1 mL (1,000 mcg total) into the muscle every 30 (thirty) days. 10 mL 1  . SPRINTEC 28 0.25-35 MG-MCG tablet Take 1 tablet by mouth daily. 3 Package 2  . SUMAtriptan (IMITREX) 25 MG tablet Take 1 tablet at migraine onset. May repeat in 2 hours if headache persists or recurs. Do not exceed 2 tablets in 24 hours. 10 tablet 0  . topiramate (TOPAMAX) 50 MG tablet TAKE 1 TABLET(50 MG) BY MOUTH AT BEDTIME 90 tablet 3  . phentermine (ADIPEX-P) 37.5 MG tablet Take 1 tablet (37.5 mg total) by mouth daily before breakfast. (Patient not taking: Reported on 09/06/2017) 30 tablet 2  . spironolactone (ALDACTONE) 50 MG tablet Take 1 tablet (50 mg total) by mouth 2 (two) times daily. Start with 50 mg twice daily (Patient not taking: Reported on 09/06/2017) 60 tablet 2   No current facility-administered  medications on file prior to visit.     BP 124/84   Pulse 97   Temp 98 F (36.7 C) (Oral)   Wt 254 lb 4 oz (115.3 kg)   LMP 08/08/2017   SpO2 98%   BMI 37.55 kg/m    Objective:   Physical Exam  Constitutional: She appears well-nourished. She appears ill.  HENT:  Right Ear: Tympanic membrane and ear canal normal.  Left Ear: Tympanic membrane and ear canal normal.  Nose: Mucosal edema present. Right sinus exhibits maxillary sinus tenderness. Right sinus exhibits no frontal sinus tenderness. Left sinus exhibits maxillary sinus tenderness. Left sinus exhibits no frontal sinus tenderness.  Mouth/Throat: Oropharynx is clear and moist.  Eyes:  Conjunctivae are normal.  Neck: Neck supple.  Cardiovascular: Normal rate and regular rhythm.  Pulmonary/Chest: Effort normal and breath sounds normal. She has no wheezes. She has no rales.  Persistent dry cough throughout exam  Lymphadenopathy:    She has no cervical adenopathy.  Skin: Skin is warm and dry.          Assessment & Plan:  Acute Bronchitis:  Cough, congestion, chills, fatigue x 2 weeks. No improvement with OTC treatment, cough is persistent. Given duration of symptoms coupled with presentation, will treat. Rx for Azithromycin course and Cheratussin cough suppressant sent to pharmacy. Fluids, rest, work note provided.  Autumn NestKatherine K Baili Stang, NP

## 2017-09-20 ENCOUNTER — Encounter: Payer: Self-pay | Admitting: Primary Care

## 2017-10-28 ENCOUNTER — Ambulatory Visit: Payer: BLUE CROSS/BLUE SHIELD | Admitting: Obstetrics and Gynecology

## 2017-10-28 ENCOUNTER — Encounter: Payer: Self-pay | Admitting: Obstetrics and Gynecology

## 2017-10-28 VITALS — BP 138/84 | HR 99 | Ht 69.0 in | Wt 234.6 lb

## 2017-10-28 DIAGNOSIS — E669 Obesity, unspecified: Secondary | ICD-10-CM | POA: Diagnosis not present

## 2017-10-28 DIAGNOSIS — Z803 Family history of malignant neoplasm of breast: Secondary | ICD-10-CM | POA: Diagnosis not present

## 2017-10-28 NOTE — Progress Notes (Signed)
SUBJECTIVE:  22 y.o. here for follow-up weight loss visit, previously seen 4 months ago. Denies any concerns and feels like medication worked well, ran out of medication. Desires restart. Has lost 20#s on her own over last few weeks. Also desires testing and mammogram for breast cancer screening. Due to family history as follows: Mom had breast cancer at age 23, with metastatic brain lesion at 2249 MGM breast cancer -unsure age Larose Kellsat Great Grandmother- breast cancer unsure age.  OBJECTIVE:  BP 138/84   Pulse 99   Ht 5\' 9"  (1.753 m)   Wt 234 lb 9.6 oz (106.4 kg)   LMP 10/21/2017   BMI 34.64 kg/m   Body mass index is 34.64 kg/m. Patient appears well. ASSESSMENT:  Obesity Family history of breast cancer PLAN:  To restart weight loss medications. Offered genetic screening- will consider Will send monthly weight & BP via MyChart, declines B12  Melody Russell SpringsShambley, CNM

## 2017-12-09 ENCOUNTER — Encounter: Payer: Self-pay | Admitting: Family Medicine

## 2017-12-09 ENCOUNTER — Ambulatory Visit: Payer: BLUE CROSS/BLUE SHIELD | Admitting: Family Medicine

## 2017-12-09 VITALS — BP 122/70 | HR 117 | Temp 99.5°F | Ht 69.0 in | Wt 228.2 lb

## 2017-12-09 DIAGNOSIS — L0292 Furuncle, unspecified: Secondary | ICD-10-CM | POA: Diagnosis not present

## 2017-12-09 MED ORDER — AMOXICILLIN-POT CLAVULANATE 875-125 MG PO TABS: 1.0000 | ORAL_TABLET | Freq: Two times a day (BID) | ORAL | 0 refills | Status: DC

## 2017-12-09 MED ORDER — SULFAMETHOXAZOLE-TRIMETHOPRIM 800-160 MG PO TABS: 1.0000 | ORAL_TABLET | Freq: Two times a day (BID) | ORAL | 0 refills | Status: DC

## 2017-12-09 MED ORDER — MUPIROCIN 2 % EX OINT: 1.0000 "application " | TOPICAL_OINTMENT | Freq: Two times a day (BID) | CUTANEOUS | 0 refills | Status: DC

## 2017-12-09 NOTE — Patient Instructions (Addendum)
I think you have boils (not ready to lance) - and the infection is causing your fever  Clean areas well with antibacterial soap and water  bactroban ointment twice daily  Stop using the tanning bed   Take both antibiotics - augmentin and bactrim as directed   Warm compresses may help  If the areas come to a head and drain- just keep them clean and lightly cover   Update if not starting to improve in a week or if worsening    Ibuprofen or aleve with food as directed can help pain and fever   Follow up with Jae Dire Friday or Monday for a re check

## 2017-12-09 NOTE — Assessment & Plan Note (Signed)
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 sulfamethoxazole-trimethoprim (BACTRIM DS,SEPTRA DS) 800-160 MG tablet    Sig: Take 1 tablet by mouth 2 (two) times daily.    Dispense:  14 tablet    Refill:  0  . amoxicillin-clavulanate (AUGMENTIN) 875-125 MG tablet    Sig: Take 1 tablet by mouth 2 (two) times daily.    Dispense:  14 tablet    Refill:  0  . mupirocin ointment (BACTROBAN) 2 %    Sig: Apply 1 application topically 2 (two) times daily. To affected areas    Dispense:  15 g    Refill:  0

## 2017-12-09 NOTE — Progress Notes (Signed)
Subjective:    Patient ID: Autumn Stanley, female    DOB: November 16, 1995, 22 y.o.   MRN: [id number]  HPI  Here for spot on back   Noticed some spots - on back/low  Several of them  Started Sunday  Put "drawing suave" on one area  A little drainage -? If pus   No known ticks    Works at a day care  Also uses a tanning bed (and works there also)    Also elevated temp  Yesterday 100.4 -did not take anything  Felt nauseated yesterday (no chance pregnant)  Temp: 99.5 F (37.5 C)     Pulse Rate: (!) 117  On phentermine   Wt Readings from Last 3 Encounters:  12/09/17 228 lb 4 oz (103.5 kg)  10/28/17 234 lb 9.6 oz (106.4 kg)  09/06/17 254 lb 4 oz (115.3 kg)   33.71 kg/m   Patient Active Problem List   Diagnosis Date Noted  . Boils 12/09/2017  . Anxiety and depression 05/26/2016  . Irregular periods 05/26/2016  . Frequent headaches 07/19/2015   Past Medical History:  Diagnosis Date  . Frequent headaches   . Kidney stone   . Migraine    Past Surgical History:  Procedure Laterality Date  . MOUTH SURGERY  2016   Social History   Tobacco Use  . Smoking status: Never Smoker  . Smokeless tobacco: Never Used  Substance Use Topics  . Alcohol use: Yes    Comment: Socially   . Drug use: No   Family History  Problem Relation Age of Onset  . Arthritis Mother   . Breast cancer Mother   . Arthritis Maternal Grandmother   . Heart disease Maternal Grandmother   . Hyperlipidemia Maternal Grandmother   . Breast cancer Maternal Grandmother   . Prostate cancer Maternal Grandfather   . Hyperlipidemia Maternal Grandfather   . Hyperlipidemia Paternal Grandmother   . Hyperlipidemia Paternal Grandfather   . Hypertension Father    No Known Allergies Current Outpatient Medications on File Prior to Visit  Medication Sig Dispense Refill  . phentermine (ADIPEX-P) 37.5 MG tablet Take 1 tablet (37.5 mg total) by mouth daily before breakfast. 30 tablet 2  . SPRINTEC 28 0.25-35  MG-MCG tablet Take 1 tablet by mouth daily. 3 Package 2  . SUMAtriptan (IMITREX) 25 MG tablet Take 1 tablet at migraine onset. May repeat in 2 hours if headache persists or recurs. Do not exceed 2 tablets in 24 hours. 10 tablet 0  . topiramate (TOPAMAX) 50 MG tablet TAKE 1 TABLET(50 MG) BY MOUTH AT BEDTIME 90 tablet 3   No current facility-administered medications on file prior to visit.     Review of Systems  Constitutional: Positive for fatigue and fever. Negative for activity change, appetite change and unexpected weight change.  HENT: Negative for congestion, ear pain, rhinorrhea, sinus pressure and sore throat.   Eyes: Negative for pain, redness and visual disturbance.  Respiratory: Negative for cough, shortness of breath and wheezing.   Cardiovascular: Negative for chest pain and palpitations.  Gastrointestinal: Negative for abdominal pain, blood in stool, constipation and diarrhea.  Endocrine: Negative for polydipsia and polyuria.  Genitourinary: Negative for dysuria, frequency and urgency.  Musculoskeletal: Negative for arthralgias, back pain and myalgias.  Skin: Positive for wound. Negative for pallor and rash.  Allergic/Immunologic: Negative for environmental allergies.  Neurological: Negative for dizziness, syncope and headaches.  Hematological: Negative for adenopathy. Does not bruise/bleed easily.  Psychiatric/Behavioral: Negative for decreased concentration and  dysphoric mood. The patient is not nervous/anxious.        Objective:   Physical Exam  Constitutional: She appears well-developed and well-nourished. No distress.  obese and well appearing   HENT:  Head: Normocephalic and atraumatic.  Eyes: Pupils are equal, round, and reactive to light. Conjunctivae and EOM are normal.  Cardiovascular: Regular rhythm and normal heart sounds.  Skin: Skin is warm and dry. No rash noted. She is not diaphoretic. No pallor.  Boil on L buttock with 1-2 cm of surrounding erythema and  induration /tender and firm (not fluctuant) 3mm open area in middle non draining   Boil on R low back/upper buttock is 0.5 cm - erythema and firm induration   2 mm papule on R groin   Very tanned   Psychiatric: She has a normal mood and affect.          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 sulfamethoxazole-trimethoprim (BACTRIM DS,SEPTRA DS) 800-160 MG tablet    Sig: Take 1 tablet by mouth 2 (two) times daily.    Dispense:  14 tablet    Refill:  0  . amoxicillin-clavulanate (AUGMENTIN) 875-125 MG tablet    Sig: Take 1 tablet by mouth 2 (two) times daily.    Dispense:  14 tablet    Refill:  0  . mupirocin ointment (BACTROBAN) 2 %    Sig: Apply 1 application topically 2 (two) times daily. To affected areas    Dispense:  15 g    Refill:  0         Relevant Medications   sulfamethoxazole-trimethoprim (BACTRIM DS,SEPTRA DS) 800-160 MG tablet   mupirocin ointment (BACTROBAN) 2 %

## 2017-12-11 ENCOUNTER — Emergency Department (HOSPITAL_COMMUNITY)
Admission: EM | Admit: 2017-12-11 | Discharge: 2017-12-11 | Disposition: A | Discharge status: Home / Self Care | Payer: BLUE CROSS/BLUE SHIELD | Attending: Emergency Medicine | Admitting: Emergency Medicine

## 2017-12-11 ENCOUNTER — Other Ambulatory Visit: Payer: Self-pay

## 2017-12-11 ENCOUNTER — Encounter (HOSPITAL_COMMUNITY): Payer: Self-pay | Admitting: Emergency Medicine

## 2017-12-11 DIAGNOSIS — Z79899 Other long term (current) drug therapy: Secondary | ICD-10-CM | POA: Insufficient documentation

## 2017-12-11 DIAGNOSIS — L03311 Cellulitis of abdominal wall: Secondary | ICD-10-CM | POA: Diagnosis not present

## 2017-12-11 DIAGNOSIS — L03319 Cellulitis of trunk, unspecified: Secondary | ICD-10-CM

## 2017-12-11 DIAGNOSIS — L03312 Cellulitis of back [any part except buttock]: Secondary | ICD-10-CM

## 2017-12-11 MED ORDER — FLUCONAZOLE 150 MG PO TABS: 150.0000 mg | ORAL_TABLET | Freq: Every day | ORAL | 1 refills | Status: DC

## 2017-12-11 MED ORDER — ONDANSETRON HCL 4 MG PO TABS
4.0000 mg | ORAL_TABLET | Freq: Once | ORAL | Status: AC
  Administered 2017-12-11: 4 mg via ORAL
  Filled 2017-12-11: qty 1

## 2017-12-11 MED ORDER — IBUPROFEN 800 MG PO TABS
800.0000 mg | ORAL_TABLET | Freq: Once | ORAL | Status: AC
  Administered 2017-12-11: 800 mg via ORAL
  Filled 2017-12-11: qty 1

## 2017-12-11 MED ORDER — DOXYCYCLINE HYCLATE 100 MG PO TABS
100.0000 mg | ORAL_TABLET | Freq: Once | ORAL | Status: AC
  Administered 2017-12-11: 100 mg via ORAL
  Filled 2017-12-11: qty 1

## 2017-12-11 MED ORDER — TRAMADOL HCL 50 MG PO TABS: ORAL_TABLET | ORAL | 0 refills | Status: DC

## 2017-12-11 MED ORDER — DOXYCYCLINE HYCLATE 100 MG PO CAPS: 100.0000 mg | ORAL_CAPSULE | Freq: Two times a day (BID) | ORAL | 0 refills | Status: DC

## 2017-12-11 NOTE — ED Provider Notes (Addendum)
[facility] EMERGENCY DEPARTMENT Provider Note   CSN: [id number] Arrival date & time: 12/11/17  1058     History   Chief Complaint Chief Complaint  Patient presents with  . Abscess    HPI [facility] is a 22 y.o. female.  Patient is a 22 year old female who presents to the emergency department with a complaint of abscess of the right buttocks and flank.  The patient states that she has been dealing with abscess issues for nearly a week.  She has squeezed these areas and got some drainage from them.  She has one that seemed to have gotten larger instead of getting better.  She was seen by her primary physician at which time she was placed on Augmentin and Bactroban ointment.  She states that this problem seems to still be getting bigger and remaining warm to touch, she is concerned about MRSA she has seen on the internet. No vever  The history is provided by the patient.  Abscess    Past Medical History:  Diagnosis Date  . Frequent headaches   . Kidney stone   . Migraine     Patient Active Problem List   Diagnosis Date Noted  . Boils 12/09/2017  . Anxiety and depression 05/26/2016  . Irregular periods 05/26/2016  . Frequent headaches 07/19/2015    Past Surgical History:  Procedure Laterality Date  . MOUTH SURGERY  2016     OB History    Gravida  0   Para  0   Term  0   Preterm  0   AB  0   Living  0     SAB  0   TAB  0   Ectopic  0   Multiple  0   Live Births  0            Home Medications    Prior to Admission medications   Medication Sig Start Date End Date Taking? Authorizing Provider  amoxicillin-clavulanate (AUGMENTIN) 875-125 MG tablet Take 1 tablet by mouth 2 (two) times daily. 12/09/17  Yes Tower, Audrie Gallus, MD  mupirocin ointment (BACTROBAN) 2 % Apply 1 application topically 2 (two) times daily. To affected areas 12/09/17  Yes Tower, Audrie Gallus, MD  phentermine (ADIPEX-P) 37.5 MG tablet Take 1 tablet (37.5 mg total) by mouth daily  before breakfast. 04/22/17  Yes Shambley, Melody N, CNM  SPRINTEC 28 0.25-35 MG-MCG tablet Take 1 tablet by mouth daily. 07/15/17  Yes Hildred Laser, MD  sulfamethoxazole-trimethoprim (BACTRIM DS,SEPTRA DS) 800-160 MG tablet Take 1 tablet by mouth 2 (two) times daily. 12/09/17  Yes Tower, Audrie Gallus, MD  SUMAtriptan (IMITREX) 25 MG tablet Take 1 tablet at migraine onset. May repeat in 2 hours if headache persists or recurs. Do not exceed 2 tablets in 24 hours. 07/19/17  Yes Lorre Munroe, NP  topiramate (TOPAMAX) 50 MG tablet TAKE 1 TABLET(50 MG) BY MOUTH AT BEDTIME 04/28/17  Yes Doreene Nest, NP    Family History Family History  Problem Relation Age of Onset  . Arthritis Mother   . Breast cancer Mother   . Arthritis Maternal Grandmother   . Heart disease Maternal Grandmother   . Hyperlipidemia Maternal Grandmother   . Breast cancer Maternal Grandmother   . Prostate cancer Maternal Grandfather   . Hyperlipidemia Maternal Grandfather   . Hyperlipidemia Paternal Grandmother   . Hyperlipidemia Paternal Grandfather   . Hypertension Father     Social History Social History   Tobacco Use  .  Smoking status: Never Smoker  . Smokeless tobacco: Never Used  Substance Use Topics  . Alcohol use: Yes    Comment: Socially   . Drug use: No     Allergies   Patient has no known allergies.   Review of Systems Review of Systems   Physical Exam Updated Vital Signs BP 119/70 (BP Location: Right Arm)   Pulse 94   Temp 98.7 F (37.1 C) (Oral)   Resp 18   Ht  (1.753 m)   Wt 103.4 kg (228 lb)   SpO2 99%   BMI 33.67 kg/m   Physical Exam  Constitutional: She is oriented to person, place, and time. She appears well-developed and well-nourished.  Non-toxic appearance.  HENT:  Head: Normocephalic.  Right Ear: Tympanic membrane and external ear normal.  Left Ear: Tympanic membrane and external ear normal.  Eyes: Pupils are equal, round, and reactive to light. EOM and lids are  normal.  Neck: Normal range of motion. Neck supple. Carotid bruit is not present.  Cardiovascular: Normal rate, regular rhythm, normal heart sounds, intact distal pulses and normal pulses.  Pulmonary/Chest: Breath sounds normal. No respiratory distress.  Abdominal: Soft. Bowel sounds are normal. There is no tenderness. There is no guarding.  Musculoskeletal: Normal range of motion.  Lymphadenopathy:       Head (right side): No submandibular adenopathy present.       Head (left side): No submandibular adenopathy present.    She has no cervical adenopathy.  Neurological: She is alert and oriented to person, place, and time. She has normal strength. No cranial nerve deficit or sensory deficit.  Skin: Skin is warm and dry.  There is a scabbed lesion of the posterior flank.  There is increased redness that extends to the upper right thigh.  This area is warm to touch.  There is induration noted.  It is tender to palpation.  There is a scabbed lesion at the right lower buttocks extending to the thigh.  Minimal to no redness noted at this area.  It too is sore but not painful.  There is a third area of the upper thigh and just under the buttocks area.  It too has a scab, but no significant redness and it is not hot.  Psychiatric: She has a normal mood and affect. Her speech is normal.  Nursing note and vitals reviewed.    ED Treatments / Results  Labs (all labs ordered are listed, but only abnormal results are displayed) Labs Reviewed - No data to display  EKG None  Radiology No results found.  Procedures [place] bedside Date/Time: 12/11/2017 1:18 PM Performed by: Ivery Quale, PA-C Authorized by: Ivery Quale, PA-C  Consent: Verbal consent obtained. Risks and benefits: risks, benefits and alternatives were discussed Consent given by: patient Patient understanding: patient states understanding of the procedure being performed Patient identity confirmed: arm band Time out: Immediately  prior to procedure a "time out" was called to verify the correct patient, procedure, equipment, support staff and site/side marked as required. Local anesthesia used: no  Anesthesia: Local anesthesia used: no  Sedation: Patient sedated: no  Patient tolerance: Patient tolerated the procedure well with no immediate complications Comments: Ultrasound of the right posterior flank extending into the upper thigh was performed.  Noted on ultrasound was cobblestone appearance, but no fluid collection appreciated.  No foreign body appreciated.  Ultrasound diagnosis is cellulitis.  Patient tolerated the procedure without problem.  Images were not archived.    (including critical  care time)  Medications Ordered in ED Medications - No data to display   Initial Impression / Assessment and Plan / ED Course  I have reviewed the triage vital signs and the nursing notes.  Pertinent labs & imaging results that were available during my care of the patient were reviewed by me and considered in my medical decision making (see chart for details).      Pt seen with me by Dr Hyacinth Meeker. Final Clinical Impressions(s) / ED Diagnoses  MDM  Vital signs within normal limits.  Pulse oximetry is within normal limits.  The patient denies a history of methicillin-resistant staph.  She had been started on Augmentin and another antibiotic she is unsure of the name of.  The problem continued to get worse instead of better.  Ultrasound of the area shows evidence of cellulitis, but no fluid collection appreciated.  I have asked the patient to continue the use of the Augmentin, and to add the doxycycline.  I have also asked her to use warm Epson salt soaks daily for about 15 minutes.  She will follow-up with Dr. Chestine Spore, or return to the emergency department if any signs of advancing infection or other problem.  Patient is in agreement with this plan.   Final diagnoses:  Cellulitis of flank    ED Discharge Orders     None       Ivery Quale, PA-C 12/11/17 1324    Ivery Quale, PA-C 12/11/17 1325    Eber Hong, MD 12/12/17 [phone]

## 2017-12-11 NOTE — ED Triage Notes (Signed)
abscess to top of rt buttock started since Friday and has gotten worse.  Was seen at pcp for abscess on lt buttock on Friday.  Was put on abx (augmentin, amoxicillin and mupirocin ointment ) pt is still taking these medications.

## 2017-12-11 NOTE — ED Provider Notes (Signed)
Medical screening examination/treatment/procedure(s) were conducted as a shared visit with non-physician practitioner(s) and myself.  I personally evaluated the patient during the encounter.  Clinical Impression:   Final diagnoses:  Cellulitis of flank    The patient is a 22 year old female who has suffered with several small infections on her back and buttock over the last week, she does not have a history of similar, she reports that she has been trying to squeeze the current infection to try to get the pus out with some success however she continues to have redness despite taking Augmentin.  She was prescribed this by her family doctor.  On exam the patient does in fact have an area of erythema and induration over her right posterior buttock and thigh which is consistent with a cellulitis.  I have performed a bedside ultrasound and find that there is cobblestoning but no signs of discrete abscess collection.  Imaged not archived  The patient is otherwise well-appearing, she can be treated with MRSA coverage by mouth, no indication for IV antibiotics or admission.  Patient is agreeable.  Stable for discharge     Eber Hong, MD 12/12/17 [phone]

## 2017-12-11 NOTE — ED Notes (Signed)
In room with pa during ultrasound exam

## 2017-12-11 NOTE — Discharge Instructions (Addendum)
Your vital signs are within normal limits.  The ultrasound suggest cellulitis, but no fluid-filled areas.  Please use warm Epson salt tub soaks for 15 minutes daily until this has resolved.  Please continue your Augmentin.  Please add doxycycline.  Please see Dr. Chestine Spore, or return to the emergency department if any high fever, excessive bleeding from the abscess areas, unusual body aches, increased redness, or signs of advancing infection.  Please do not continue to squeeze these areas or stick anything in the, as that will forward the infection.  Use ibuprofen with breakfast, lunch, dinner, and at bedtime.  May use Ultram for more severe pain.  Ultram may cause drowsiness, please do not drive a vehicle, drink alcohol, operate machinery, or participate in activities requiring concentration when taking this medication.

## 2018-02-11 ENCOUNTER — Other Ambulatory Visit: Payer: Self-pay | Admitting: Obstetrics and Gynecology
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
## 2018-04-27 ENCOUNTER — Encounter: Payer: BLUE CROSS/BLUE SHIELD | Admitting: Obstetrics and Gynecology

## 2018-05-15 ENCOUNTER — Other Ambulatory Visit: Payer: Self-pay | Admitting: Primary Care

## 2018-05-15 DIAGNOSIS — R51 Headache: Principal | ICD-10-CM

## 2018-05-15 DIAGNOSIS — R519 Headache, unspecified: Secondary | ICD-10-CM

## 2018-06-08 ENCOUNTER — Encounter: Payer: BLUE CROSS/BLUE SHIELD | Admitting: Obstetrics and Gynecology

## 2018-08-18 ENCOUNTER — Encounter: Payer: Self-pay | Admitting: Obstetrics and Gynecology

## 2018-08-19 ENCOUNTER — Other Ambulatory Visit: Payer: Self-pay | Admitting: Obstetrics and Gynecology

## 2018-08-23 ENCOUNTER — Ambulatory Visit (INDEPENDENT_AMBULATORY_CARE_PROVIDER_SITE_OTHER): Payer: 59 | Admitting: Obstetrics and Gynecology

## 2018-08-23 ENCOUNTER — Encounter: Payer: Self-pay | Admitting: Obstetrics and Gynecology

## 2018-08-23 VITALS — BP 125/82 | HR 112 | Ht 69.0 in | Wt 263.0 lb

## 2018-08-23 VITALS — BP 125/82 | HR 112 | Wt 263.8 lb

## 2018-08-23 DIAGNOSIS — Z113 Encounter for screening for infections with a predominantly sexual mode of transmission: Secondary | ICD-10-CM

## 2018-08-23 DIAGNOSIS — Z6838 Body mass index (BMI) 38.0-38.9, adult: Secondary | ICD-10-CM

## 2018-08-23 DIAGNOSIS — Z13220 Encounter for screening for lipoid disorders: Secondary | ICD-10-CM | POA: Diagnosis not present

## 2018-08-23 DIAGNOSIS — Z131 Encounter for screening for diabetes mellitus: Secondary | ICD-10-CM

## 2018-08-23 DIAGNOSIS — N946 Dysmenorrhea, unspecified: Secondary | ICD-10-CM | POA: Diagnosis not present

## 2018-08-23 DIAGNOSIS — Z01419 Encounter for gynecological examination (general) (routine) without abnormal findings: Secondary | ICD-10-CM

## 2018-08-23 DIAGNOSIS — E669 Obesity, unspecified: Secondary | ICD-10-CM

## 2018-08-23 DIAGNOSIS — E66812 Obesity, class 2: Secondary | ICD-10-CM

## 2018-08-23 MED ORDER — NAPROXEN SODIUM 550 MG PO TABS: 550.0000 mg | ORAL_TABLET | Freq: Two times a day (BID) | ORAL | 2 refills | Status: DC

## 2018-08-23 MED ORDER — CYANOCOBALAMIN 1000 MCG/ML IJ SOLN: 1000.0000 ug | INTRAMUSCULAR | 1 refills | Status: DC

## 2018-08-23 MED ORDER — PHENTERMINE HCL 37.5 MG PO TABS
37.5000 mg | ORAL_TABLET | Freq: Every day | ORAL | 2 refills | Status: DC
Start: 2018-08-23 — End: 2019-03-13

## 2018-08-23 NOTE — Progress Notes (Signed)
Subjective:  Autumn Stanley is a 23 y.o. G0P0000 at Unknown being seen today for weight loss management- initial visit.  Patient reports General ROS: negative and reports previous weight loss attempts: were successful with adipex and B12. Started new/second job at [employer] in October and was eating take out a lot, gained 25 lbs back. 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 Desires restart of meds and plans to make meals ahead of time, increase water intake and start walming.   Previous treatment includes: small frequent feedings, nutritional supplement, vitamin supplement, low carb diet,  vitamin B-12 injections and appetite Suppressant.  Pertinent medical history includes: chronic digestive disease, diabetes, eating disorder, anxiety and psychiatric illness.     The following portions of the patient's history were reviewed and updated as appropriate: allergies, current medications, past family history, past medical history, past social history, past surgical history and problem list.   Objective:   Vitals:   08/23/18 1012  BP: 125/82  Pulse: (!) 112  Weight: 263 lb (119.3 kg)  Height: 5\' 9"  (1.753 m)    General:  Alert, oriented and cooperative. Patient is in no acute distress.  :   :   :   :   :   :   PE: Well groomed female in no current distress,   Mental Status: Normal mood and affect. Normal behavior. Normal judgment and thought content.   Current BMI: Body mass index is 38.84 kg/m.  Waist 48 in Assessment and Plan:  Obesity  There are no diagnoses linked to this encounter.  Plan: low carb, High protein diet RX for adipex 37.5 mg daily and B12 .ml monthly, to start now with first injection given at today's visit. Reviewed side-effects common to both medications and expected outcomes. Increase daily water intake to at least 8 bottle a day, every day.  Goal is to reduse weight by 10% by end of three months, and will re-evaluate then.  RTC in 4  weeks for Nurse visit to check weight & BP, and get next B12 injections.    Please refer to After Visit Summary for other counseling recommendations.    [place], Melody N, CNM   Melody [place], CNM      Consider the Low Glycemic Index Diet and 6 smaller meals daily .  This boosts your metabolism and regulates your sugars:   Use the protein bar by Atkins because they have lots of fiber in them  Find the low carb flatbreads, tortillas and pita breads for sandwiches:  Joseph's makes a pita bread and a flat bread , available at [facility] and [employer]; Toufayah makes a low carb flatbread available at [employer] and HT that is 9 net carbs and 100 cal Mission makes a low carb whole wheat tortilla available at [employer] most grocery stores with 6 net carbs and 210 cal  [place] yogurt can still have a lot of carbs .  Dannon Light N fit has 80 cal and 8 carbs

## 2018-08-23 NOTE — Progress Notes (Signed)
   PT is present today for her annual exam. Pt stated that she is doing well and denies any issues. No problems or concerns.    

## 2018-08-23 NOTE — Patient Instructions (Addendum)
Increase daily water intake to at least 8 bottle a day, every day.  Goal is to reduse weight by 10% (26 lbs)  by end of three months, and will re-evaluate then.       Consider the Low Glycemic Index Diet and 6 smaller meals daily .  This boosts your metabolism and regulates your sugars:   Use the protein bar by Atkins because they have lots of fiber in them  Find the low carb flatbreads, tortillas and pita breads for sandwiches:  Joseph's makes a pita bread and a flat bread , available at [facility] and [employer]; Toufayah makes a low carb flatbread available at [employer] and HT that is 9 net carbs and 100 cal Mission makes a low carb whole wheat tortilla available at [employer] most grocery stores with 6 net carbs and 210 cal  [place] yogurt can still have a lot of carbs .  Dannon Light N fit has 80 cal and 8 carbs

## 2018-08-23 NOTE — Patient Instructions (Signed)

## 2018-08-23 NOTE — Progress Notes (Signed)
GYNECOLOGY ANNUAL PHYSICAL EXAM PROGRESS NOTE  Subjective:    Autumn Stanley is a 23 y.o. G0P0000 female who presents for an annual exam.  The patient is sexually active.  The patient wears seatbelts: yes. The patient participates in regular exercise: no. Has the patient ever been transfused or tattooed?: no. The patient reports that there is not domestic violence in her life.    The patient has the complaints today:  1. She states that she is still experiencing dysmenorrhea even on current birth control. Takes Ibuprofen and Tylenol. Cycles last only 3-4 days and are light to moderate.  2. Patient notes difficulty losing weight. Is planning on resuming weight loss therapy with medication with Harlow MaresMelody Shambley, CNM.    Gynecologic History LMP occasional, however  Menarche age: [age] Period Cycle (Days): 31 Period Duration (Days): 3-4 Period Pattern: Regular Menstrual Flow: Light Dysmenorrhea: (!) Moderate Dysmenorrhea Symptoms: Cramping  Contraception: OCP (estrogen/progesterone) History of STI's: Denies Last Pap: 04/21/2017.  Denies history of abnormal pap smears.    Obstetric History   G0   P0   T0   P0   A0   L0    SAB0   TAB0   Ectopic0   Multiple0   Live Births0       Past Medical History:  Diagnosis Date  . Frequent headaches   . Kidney stone   . Migraine      Past Surgical History:  Procedure Laterality Date  . MOUTH SURGERY  2016         Family History  Problem Relation Age of Onset  . Arthritis Mother   . Breast cancer Mother 2440's  . Arthritis Maternal Grandmother   . Heart disease Maternal Grandmother   . Hyperlipidemia Maternal Grandmother   . Breast cancer Maternal Grandmother 70's  . Prostate cancer Maternal Grandfather   . Hyperlipidemia Maternal Grandfather   . Hyperlipidemia Paternal Grandmother   . Hyperlipidemia Paternal Grandfather   . Hypertension Father     Social History   Socioeconomic History  .  Marital status: Single    Spouse name: Not on file  . Number of children: Not on file  . Years of education: Not on file  . Highest education level: Not on file  Occupational History  . Occupation: Administrator, sportsdaycare worker  Social Needs  . Financial resource strain: Not on file  . Food insecurity:    Worry: Not on file    Inability: Not on file  . Transportation needs:    Medical: Not on file    Non-medical: Not on file  Tobacco Use  . Smoking status: Never Smoker  . Smokeless tobacco: Never Used  Substance and Sexual Activity  . Alcohol use: Yes    Comment: Socially   . Drug use: No  . Sexual activity: Yes    Birth control/protection: Pill  Lifestyle  . Physical activity:    Days per week: Not on file    Minutes per session: Not on file  . Stress: Not on file  Relationships  . Social connections:    Talks on phone: Not on file    Gets together: Not on file    Attends religious service: Not on file    Active member of club or organization: Not on file    Attends meetings of clubs or organizations: Not on file    Relationship status: Not on file  . Intimate partner violence:    Fear of current or ex partner: Not  on file    Emotionally abused: Not on file    Physically abused: Not on file    Forced sexual activity: Not on file  Other Topics Concern  . Not on file  Social History Narrative  . Not on file     Outpatient Encounter Medications as of 08/23/2018  Medication Sig  . SPRINTEC 28 0.25-35 MG-MCG tablet TAKE 1 TABLET BY MOUTH EVERY DAY  . topiramate (TOPAMAX) 50 MG tablet Take 1 tablet (50 mg total) by mouth at bedtime. MUST SCHEDULE ANNUAL EXAM  . naproxen sodium (ANAPROX DS) 550 MG tablet Take 1 tablet (550 mg total) by mouth 2 (two) times daily with a meal.  . [DISCONTINUED] amoxicillin-clavulanate (AUGMENTIN) 875-125 MG tablet Take 1 tablet by mouth 2 (two) times daily. (Patient not taking: Reported on 08/23/2018)  . [DISCONTINUED] doxycycline (VIBRAMYCIN) 100 MG  capsule Take 1 capsule (100 mg total) by mouth 2 (two) times daily. (Patient not taking: Reported on 08/23/2018)  . [DISCONTINUED] fluconazole (DIFLUCAN) 150 MG tablet Take 1 tablet (150 mg total) by mouth daily. Use for yeast infection. (Patient not taking: Reported on 08/23/2018)  . [DISCONTINUED] mupirocin ointment (BACTROBAN) 2 % Apply 1 application topically 2 (two) times daily. To affected areas (Patient not taking: Reported on 08/23/2018)  . [DISCONTINUED] phentermine (ADIPEX-P) 37.5 MG tablet Take 1 tablet (37.5 mg total) by mouth daily before breakfast. (Patient not taking: Reported on 08/23/2018)  . [DISCONTINUED] SPRINTEC 28 0.25-35 MG-MCG tablet TAKE 1 TABLET BY MOUTH EVERY DAY  . [DISCONTINUED] sulfamethoxazole-trimethoprim (BACTRIM DS,SEPTRA DS) 800-160 MG tablet Take 1 tablet by mouth 2 (two) times daily. (Patient not taking: Reported on 08/23/2018)  . [DISCONTINUED] SUMAtriptan (IMITREX) 25 MG tablet Take 1 tablet at migraine onset. May repeat in 2 hours if headache persists or recurs. Do not exceed 2 tablets in 24 hours. (Patient not taking: Reported on 08/23/2018)  . [DISCONTINUED] traMADol (ULTRAM) 50 MG tablet 1 or 2 po q6h prn pain (Patient not taking: Reported on 08/23/2018)   No facility-administered encounter medications on file as of 08/23/2018.       No Known Allergies   Review of Systems Constitutional: negative for chills, fatigue, fevers and sweats.  Positive for weight gain.  Eyes: negative for irritation, redness and visual disturbance Ears, nose, mouth, throat, and face: negative for hearing loss, nasal congestion, snoring and tinnitus Respiratory: negative for asthma, cough, sputum Cardiovascular: negative for chest pain, dyspnea, exertional chest pressure/discomfort, irregular heart beat, palpitations and syncope Gastrointestinal: negative for abdominal pain, change in bowel habits, nausea and vomiting Genitourinary: negative for abnormal menstrual periods,  genital lesions, sexual problems and vaginal discharge, dysuria and urinary incontinence. Positive for dysmenorrhea.  Integument/breast: negative for breast lump, breast tenderness and nipple discharge Hematologic/lymphatic: negative for bleeding and easy bruising Musculoskeletal:negative for back pain and muscle weakness Neurological: negative for dizziness, headaches, vertigo and weakness Endocrine: negative for diabetic symptoms including polydipsia, polyuria and skin dryness Allergic/Immunologic: negative for hay fever and urticaria        Objective:  Blood pressure 125/82, pulse (!) 112, weight 263 lb 12.8 oz (119.7 kg). Body mass index is 38.96 kg/m.  General Appearance:    Alert, cooperative, no distress, appears stated age, moderate obesity  Head:    Normocephalic, without obvious abnormality, atraumatic  Eyes:    PERRL, conjunctiva/corneas clear, EOM's intact, both eyes  Ears:    Normal external ear canals, both ears  Nose:   Nares normal, septum midline, mucosa normal, no drainage or sinus  tenderness  Throat:   Lips, mucosa, and tongue normal; teeth and gums normal  Neck:   Supple, symmetrical, trachea midline, no adenopathy; thyroid: no enlargement/tenderness/nodules; no carotid bruit or JVD  Back:     Symmetric, no curvature, ROM normal, no CVA tenderness  Lungs:     Clear to auscultation bilaterally, respirations unlabored  Chest Wall:    No tenderness or deformity   Heart:    Regular rate and rhythm, S1 and S2 normal, no murmur, rub or gallop  Breast Exam:    No tenderness, masses, or nipple abnormality  Abdomen:     Soft, non-tender, bowel sounds active all four quadrants, no masses, no organomegaly.    Genitalia:    Pelvic:external genitalia normal, vagina without lesions, discharge, or tenderness, rectovaginal septum  normal. Cervix normal in appearance, no cervical motion tenderness, no adnexal masses or tenderness.  Uterus normal size, shape, mobile, regular contours,  nontender.  Rectal:    Normal external sphincter.  No hemorrhoids appreciated. Internal exam not done.   Extremities:   Extremities normal, atraumatic, no cyanosis or edema  Pulses:   2+ and symmetric all extremities  Skin:   Skin color, texture, turgor normal, no rashes or lesions  Lymph nodes:   Cervical, supraclavicular, and axillary nodes normal  Neurologic:   CNII-XII intact, normal strength, sensation and reflexes throughout   .  Labs:   Lab Results  Component Value Date   WBC 10.6 (H) 07/08/2016   HGB 12.9 07/08/2016   HCT 38.2 07/08/2016   MCV 86.1 07/08/2016   PLT 376.0 07/08/2016     Chemistry      Component Value Date/Time   NA 137 04/28/2017 0832   K 4.1 04/28/2017 0832   CL 103 04/28/2017 0832   CO2 26 04/28/2017 0832   BUN 13 04/28/2017 0832   CREATININE 0.73 04/28/2017 0832      Component Value Date/Time   CALCIUM 9.5 04/28/2017 0832   ALKPHOS 61 04/28/2017 0832   AST 16 04/28/2017 0832   ALT 15 04/28/2017 0832   BILITOT 0.6 04/28/2017 0832         Assessment:   Encounter for well woman exam with routine gynecological exam Dysmenorrhea Class 2 obesity without serious comorbidity with body mass index (BMI) of 38.0 to 38.9 in adult, unspecified obesity type  Screen for STD   Plan:     Blood tests: CBC, CMP, Lipid panel, TSH, A1c.  Breast self exam technique reviewed and patient encouraged to perform self-exam monthly. Contraception: OCP (estrogen/progesterone). Discussed healthy lifestyle modifications. Pap smear up to date.  STD testing with HIV and RPR performed per ACOG recommendations..  Had GC/Cl testing last year, currently not sexually active.  Will refer back to [facility], CNM for weight loss management.  Declines flu vaccine.  To f/u in 1 year for annual exam     Hildred Laser, MD [facility]

## 2018-08-23 NOTE — Progress Notes (Signed)
Waist 48 in

## 2018-08-24 ENCOUNTER — Encounter: Payer: Self-pay | Admitting: Obstetrics and Gynecology

## 2018-08-24 LAB — COMPREHENSIVE METABOLIC PANEL
ALT: 114 IU/L — AB (ref 0–32)
AST: 51 IU/L — AB (ref 0–40)
Albumin/Globulin Ratio: 1.5 (ref 1.2–2.2)
Albumin: 4.3 g/dL (ref 3.9–5.0)
Alkaline Phosphatase: 112 IU/L (ref 39–117)
BUN/Creatinine Ratio: 10 (ref 9–23)
BUN: 8 mg/dL (ref 6–20)
Bilirubin Total: 0.4 mg/dL (ref 0.0–1.2)
CALCIUM: 9.1 mg/dL (ref 8.7–10.2)
CO2: 20 mmol/L (ref 20–29)
CREATININE: 0.79 mg/dL (ref 0.57–1.00)
Chloride: 101 mmol/L (ref 96–106)
GFR calc Af Amer: 122 mL/min/{1.73_m2} (ref >59)
GFR, EST NON AFRICAN AMERICAN: 106 mL/min/{1.73_m2} (ref >59)
GLOBULIN, TOTAL: 2.8 g/dL (ref 1.5–4.5)
GLUCOSE: 87 mg/dL (ref 65–99)
Potassium: 3.8 mmol/L (ref 3.5–5.2)
Sodium: 138 mmol/L (ref 134–144)
TOTAL PROTEIN: 7.1 g/dL (ref 6.0–8.5)

## 2018-08-24 LAB — CBC
Hematocrit: 38.3 % (ref 34.0–46.6)
Hemoglobin: 12.7 g/dL (ref 11.1–15.9)
MCH: 29.1 pg (ref 26.6–33.0)
MCHC: 33.2 g/dL (ref 31.5–35.7)
MCV: 88 fL (ref 79–97)
PLATELETS: 235 10*3/uL (ref 150–450)
RBC: 4.37 x10E6/uL (ref 3.77–5.28)
RDW: 13.4 % (ref 11.7–15.4)
WBC: 5.8 10*3/uL (ref 3.4–10.8)

## 2018-08-24 LAB — LIPID PANEL
CHOLESTEROL TOTAL: 163 mg/dL (ref 100–199)
Chol/HDL Ratio: 4.7 ratio — ABNORMAL HIGH (ref 0.0–4.4)
HDL: 35 mg/dL — ABNORMAL LOW (ref >39)
LDL CALC: 85 mg/dL (ref 0–99)
TRIGLYCERIDES: 214 mg/dL — AB (ref 0–149)
VLDL CHOLESTEROL CAL: 43 mg/dL — AB (ref 5–40)

## 2018-08-24 LAB — HEMOGLOBIN A1C
Est. average glucose Bld gHb Est-mCnc: 103 mg/dL
Hgb A1c MFr Bld: 5.2 % (ref 4.8–5.6)

## 2018-08-24 LAB — RPR: RPR Ser Ql: NONREACTIVE

## 2018-08-24 LAB — TSH: TSH: 2.12 u[IU]/mL (ref 0.450–4.500)

## 2018-08-24 LAB — HIV ANTIBODY (ROUTINE TESTING W REFLEX): HIV Screen 4th Generation wRfx: NONREACTIVE

## 2018-08-25 ENCOUNTER — Encounter: Payer: Self-pay | Admitting: Obstetrics and Gynecology

## 2018-08-25 MED ORDER — SPRINTEC 28 0.25-35 MG-MCG PO TABS: 1.0000 | ORAL_TABLET | Freq: Every day | ORAL | 11 refills | Status: DC

## 2018-09-21 ENCOUNTER — Ambulatory Visit (INDEPENDENT_AMBULATORY_CARE_PROVIDER_SITE_OTHER): Payer: 59 | Admitting: Obstetrics and Gynecology

## 2018-09-21 VITALS — BP 125/88 | Wt 263.0 lb

## 2018-09-21 DIAGNOSIS — E663 Overweight: Secondary | ICD-10-CM

## 2018-09-21 DIAGNOSIS — L689 Hypertrichosis, unspecified: Secondary | ICD-10-CM

## 2018-09-21 DIAGNOSIS — L739 Follicular disorder, unspecified: Secondary | ICD-10-CM

## 2018-09-21 MED ORDER — CYANOCOBALAMIN 1000 MCG/ML IJ SOLN
1000.0000 ug | Freq: Once | INTRAMUSCULAR | Status: AC
  Administered 2018-09-21: 1000 ug via INTRAMUSCULAR

## 2018-09-21 MED ORDER — SULFAMETHOXAZOLE-TRIMETHOPRIM 800-160 MG PO TABS: 1.0000 | ORAL_TABLET | Freq: Two times a day (BID) | ORAL | 0 refills | Status: DC

## 2018-09-21 NOTE — Progress Notes (Signed)
S:Pt is here for wt, bp check, b-12 inj She is doing well, denies any s/e  While here she reports a increased amount of hair on chin and neck and groin that extends to buttocks and down back of both thighs.(she states her maternal aunt has the same type hair growth on chin) She has been shaving both areas for a few months. And a week ago she developed multiple sores and boils on buttocks and down back of thighs. Concerned as she was told she had MRSA last year in a boil under her arm.  She has tried epsom salt soaks and applied aquphor with no resolution.   O: 09/21/18 wt- 253.6lb(08/23/18 wt- 263lb)  Waist 46 in Female patterned hair growth on neck and chin. Multiple folliculitis and boils noted throughout groin, and both buttocks and down both thighs, white papule centers and red raised rings around outside of each.   A: obesity-doing well on weight loss Excessive hair growth Folliculitis H/o MRSA  P: will continue on adipex, and B12 injection given. Congratulated on current weight loss.  Labs obtained-will consider spirinolatone in future Skin culture obtained- and will follow up accordingly Bactrim DS bid x 7 prescribed.instructed to aviod shaving for now, avoid lotion and thick soaps in that area, throw away any previous used razors. Epsom salt soaks are still ok.  RTC 1 month  Melody Shambley,CNM

## 2018-09-21 NOTE — Addendum Note (Signed)
Addended by: Rosine Beat L on: 09/21/2018 02:27 PM   Modules accepted: Orders

## 2018-09-22 LAB — DHEA-SULFATE: DHEA-SO4: 127.7 ug/dL (ref 110.0–431.7)

## 2018-09-22 LAB — TESTOSTERONE: Testosterone: 25 ng/dL (ref 8–48)

## 2018-09-24 ENCOUNTER — Emergency Department
Admission: EM | Admit: 2018-09-24 | Discharge: 2018-09-24 | Disposition: A | Discharge status: Home / Self Care | Payer: 59 | Attending: Emergency Medicine | Admitting: Emergency Medicine

## 2018-09-24 ENCOUNTER — Other Ambulatory Visit: Payer: Self-pay

## 2018-09-24 ENCOUNTER — Encounter: Payer: Self-pay | Admitting: Emergency Medicine

## 2018-09-24 DIAGNOSIS — L0291 Cutaneous abscess, unspecified: Secondary | ICD-10-CM | POA: Diagnosis not present

## 2018-09-24 DIAGNOSIS — L739 Follicular disorder, unspecified: Secondary | ICD-10-CM | POA: Diagnosis not present

## 2018-09-24 DIAGNOSIS — R21 Rash and other nonspecific skin eruption: Secondary | ICD-10-CM | POA: Diagnosis present

## 2018-09-24 DIAGNOSIS — Z79899 Other long term (current) drug therapy: Secondary | ICD-10-CM | POA: Insufficient documentation

## 2018-09-24 MED ORDER — CLINDAMYCIN HCL 300 MG PO CAPS: 300.0000 mg | ORAL_CAPSULE | Freq: Three times a day (TID) | ORAL | 0 refills | Status: AC

## 2018-09-24 MED ORDER — CHLORHEXIDINE GLUCONATE 4 % EX LIQD: Freq: Every day | CUTANEOUS | 0 refills | Status: DC | PRN

## 2018-09-24 NOTE — Discharge Instructions (Signed)
Please begin clindamycin and continue your Bactrim.  You can do showers with chlorhexidine daily.  Apply warm compresses to abscesses.  Please follow-up with primary care as soon as possible.  I have given you referrals to dermatology and wound care.  Return the emergency department for fever or if any specific abscess becomes enlarged and exquisitely tender.

## 2018-09-24 NOTE — ED Triage Notes (Signed)
C/O several "abscesses" to buttocks.  States has been treated at [facility] for similar in the past, and since initial occurrence, occasionally has "break outs".

## 2018-09-24 NOTE — ED Provider Notes (Signed)
[facility] Emergency Department Provider Note  ____________________________________________  Time seen: Approximately 1:06 PM  I have reviewed the triage vital signs and the nursing notes.   HISTORY  Chief Complaint Abscess    HPI Autumn Stanley is a 23 y.o. female presents emergency department for evaluation of multiple abscesses to buttocks and bilateral arms.  Patient states that she has been getting these outbreaks of abscesses since April.  She had a routine checkup with her gynecologist 2 days ago, had blood work and a culture completed and was placed on Bactrim.  She is not sure if areas are getting any better.  It is painful to sit.  No fevers.  Past Medical History:  Diagnosis Date  . Frequent headaches   . Kidney stone   . Migraine     Patient Active Problem List   Diagnosis Date Noted  . Boils 12/09/2017  . Anxiety and depression 05/26/2016  . Irregular periods 05/26/2016  . Frequent headaches 07/19/2015    Past Surgical History:  Procedure Laterality Date  . MOUTH SURGERY  2016    Prior to Admission medications   Medication Sig Start Date End Date Taking? Authorizing Provider  chlorhexidine (HIBICLENS) 4 % external liquid Apply topically daily as needed. 09/24/18   Enid Derry, PA-C  clindamycin (CLEOCIN) 300 MG capsule Take 1 capsule (300 mg total) by mouth 3 (three) times daily for 10 days. 09/24/18 10/04/18  Enid Derry, PA-C  cyanocobalamin (,VITAMIN B-12,) 1000 MCG/ML injection Inject 1 mL (1,000 mcg total) into the muscle every 30 (thirty) days. 08/23/18   Shambley, Melody N, CNM  naproxen sodium (ANAPROX DS) 550 MG tablet Take 1 tablet (550 mg total) by mouth 2 (two) times daily with a meal. 08/23/18   Hildred Laser, MD  phentermine (ADIPEX-P) 37.5 MG tablet Take 1 tablet (37.5 mg total) by mouth daily before breakfast. 08/23/18   Shambley, Melody N, CNM  SPRINTEC 28 0.25-35 MG-MCG tablet Take 1 tablet by mouth daily. 08/25/18    Hildred Laser, MD  sulfamethoxazole-trimethoprim (BACTRIM DS,SEPTRA DS) 800-160 MG tablet Take 1 tablet by mouth 2 (two) times daily. Patient not taking: Reported on 09/21/2018 09/21/18   Shambley, Melody N, CNM  topiramate (TOPAMAX) 50 MG tablet Take 1 tablet (50 mg total) by mouth at bedtime. MUST SCHEDULE ANNUAL EXAM 05/17/18   Doreene Nest, NP    Allergies Patient has no known allergies.  Family History  Problem Relation Age of Onset  . Arthritis Mother   . Breast cancer Mother   . Arthritis Maternal Grandmother   . Heart disease Maternal Grandmother   . Hyperlipidemia Maternal Grandmother   . Breast cancer Maternal Grandmother   . Prostate cancer Maternal Grandfather   . Hyperlipidemia Maternal Grandfather   . Hyperlipidemia Paternal Grandmother   . Hyperlipidemia Paternal Grandfather   . Hypertension Father     Social History Social History   Tobacco Use  . Smoking status: Never Smoker  . Smokeless tobacco: Never Used  Substance Use Topics  . Alcohol use: Yes    Comment: Socially   . Drug use: No     Review of Systems  Constitutional: No fever/chills Respiratory: No SOB. Gastrointestinal:  No nausea, no vomiting.  Musculoskeletal: Negative for musculoskeletal pain. Skin: Negative for abrasions, lacerations, ecchymosis. Positive for rash.  Neurological: Negative for headaches, numbness or tingling   ____________________________________________   PHYSICAL EXAM:  VITAL SIGNS: ED Triage Vitals  Enc Vitals Group     BP 09/24/18 1151  114/75     Pulse Rate 09/24/18 1151 96     Resp 09/24/18 1151 14     Temp 09/24/18 1151 98 F (36.7 C)     Temp Source 09/24/18 1151 Oral     SpO2 09/24/18 1151 99 %     Weight 09/24/18 1144 263 lb 0.1 oz (119.3 kg)     Height 09/24/18 1152  (1.753 m)     Head Circumference --      Peak Flow --      Pain Score 09/24/18 1144 8     Pain Loc --      Pain Edu? --      Excl. in GC? --      Constitutional: Alert  and oriented. Well appearing and in no acute distress. Eyes: Conjunctivae are normal. PERRL. EOMI. Head: Atraumatic. ENT:      Ears:      Nose: No congestion/rhinnorhea.      Mouth/Throat: Mucous membranes are moist.  Neck: No stridor.  Cardiovascular: Normal rate, regular rhythm.  Good peripheral circulation. Respiratory: Normal respiratory effort without tachypnea or retractions. Lungs CTAB. Good air entry to the bases with no decreased or absent breath sounds. Musculoskeletal: Full range of motion to all extremities. No gross deformities appreciated. Neurologic:  Normal speech and language. No gross focal neurologic deficits are appreciated.  Skin:  Skin is warm, dry and intact. Multiple small pustules with mild surrounding erythema. No fluctuance. 2 pustules to right upper arm. 1cm by 1cm area of swelling to left axilla without tenderness or overlying erythema.  Psychiatric: Mood and affect are normal. Speech and behavior are normal. Patient exhibits appropriate insight and judgement.   ____________________________________________   LABS (all labs ordered are listed, but only abnormal results are displayed)  Labs Reviewed - No data to display ____________________________________________  EKG   ____________________________________________  RADIOLOGY   No results found.  ____________________________________________    PROCEDURES  Procedure(s) performed:    Procedures    Medications - No data to display   ____________________________________________   INITIAL IMPRESSION / ASSESSMENT AND PLAN / ED COURSE  Pertinent labs & imaging results that were available during my care of the patient were reviewed by me and considered in my medical decision making (see chart for details).  Review of the [place] CSRS was performed in accordance of the [facility] prior to dispensing any controlled drugs.     Patient's diagnosis is consistent with folliculitis.  Vital signs and exam  are reassuring.  Patient will be started on clindamycin in addition to her Bactrim.  Patient will be discharged home with prescriptions for clindamycin and chlorhexidine. Patient is to follow up with primary care, dermatology, wound care as directed. Patient is given ED precautions to return to the ED for any worsening or new symptoms.     ____________________________________________  FINAL CLINICAL IMPRESSION(S) / ED DIAGNOSES  Final diagnoses:  Folliculitis  Abscess      NEW MEDICATIONS STARTED DURING THIS VISIT:  ED Discharge Orders         Ordered    clindamycin (CLEOCIN) 300 MG capsule  3 times daily     09/24/18 1304    chlorhexidine (HIBICLENS) 4 % external liquid  Daily PRN     09/24/18 1304              This chart was dictated using voice recognition software/Dragon. Despite best efforts to proofread, errors can occur which can change the meaning. Any change was purely unintentional.  Enid Derry, PA-C 09/24/18 1436    Minna Antis, MD 09/24/18 [phone]

## 2018-09-26 LAB — ANAEROBIC AND AEROBIC CULTURE

## 2018-10-04 ENCOUNTER — Other Ambulatory Visit: Payer: Self-pay | Admitting: Primary Care

## 2018-10-04 DIAGNOSIS — R51 Headache: Principal | ICD-10-CM

## 2018-10-04 DIAGNOSIS — R519 Headache, unspecified: Secondary | ICD-10-CM

## 2018-10-19 ENCOUNTER — Encounter: Payer: 59 | Admitting: Obstetrics and Gynecology

## 2018-10-28 ENCOUNTER — Other Ambulatory Visit: Payer: Self-pay | Admitting: Primary Care

## 2018-10-28 DIAGNOSIS — R519 Headache, unspecified: Secondary | ICD-10-CM

## 2018-10-28 DIAGNOSIS — R51 Headache: Principal | ICD-10-CM

## 2019-03-13 ENCOUNTER — Other Ambulatory Visit: Payer: Self-pay

## 2019-03-13 ENCOUNTER — Encounter: Payer: Self-pay | Admitting: Family Medicine

## 2019-03-13 ENCOUNTER — Ambulatory Visit (INDEPENDENT_AMBULATORY_CARE_PROVIDER_SITE_OTHER): Payer: BLUE CROSS/BLUE SHIELD | Admitting: Family Medicine

## 2019-03-13 VITALS — BP 102/70 | HR 51 | Temp 98.3°F | Ht 69.0 in | Wt 255.1 lb

## 2019-03-13 DIAGNOSIS — L03113 Cellulitis of right upper limb: Secondary | ICD-10-CM

## 2019-03-13 MED ORDER — CEPHALEXIN 500 MG PO CAPS: 500.0000 mg | ORAL_CAPSULE | Freq: Three times a day (TID) | ORAL | 0 refills | Status: DC
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
## 2019-03-13 NOTE — Patient Instructions (Signed)
Good to see you today  Keep arm elevated and apply cool compresses several times a day  Take antibiotic as directed  Notify me if any worsening symptoms, fever over 100, body aches

## 2019-03-27 ENCOUNTER — Other Ambulatory Visit: Payer: Self-pay

## 2019-03-27 ENCOUNTER — Emergency Department
Admission: EM | Admit: 2019-03-27 | Discharge: 2019-03-27 | Disposition: A | Discharge status: Home / Self Care | Payer: BC Managed Care – PPO | Attending: Emergency Medicine | Admitting: Emergency Medicine

## 2019-03-27 DIAGNOSIS — T7421XA Adult sexual abuse, confirmed, initial encounter: Secondary | ICD-10-CM

## 2019-03-27 DIAGNOSIS — Z0441 Encounter for examination and observation following alleged adult rape: Secondary | ICD-10-CM | POA: Diagnosis not present

## 2019-03-27 MED ORDER — ACETAMINOPHEN 500 MG PO TABS
1000.0000 mg | ORAL_TABLET | Freq: Once | ORAL | Status: AC
  Administered 2019-03-27: 03:00:00 1000 mg via ORAL
  Filled 2019-03-27: qty 2

## 2019-03-27 NOTE — ED Notes (Signed)
Melissa, SANE RN in to speak with pt.
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
## 2019-04-17 ENCOUNTER — Encounter: Payer: Self-pay | Admitting: Primary Care

## 2019-04-17 ENCOUNTER — Ambulatory Visit (INDEPENDENT_AMBULATORY_CARE_PROVIDER_SITE_OTHER): Payer: BC Managed Care – PPO | Admitting: Primary Care

## 2019-04-17 ENCOUNTER — Other Ambulatory Visit: Payer: Self-pay

## 2019-04-17 VITALS — BP 124/84 | HR 60 | Temp 98.7°F | Ht 69.0 in | Wt 250.5 lb

## 2019-04-17 DIAGNOSIS — F329 Major depressive disorder, single episode, unspecified: Secondary | ICD-10-CM | POA: Diagnosis not present

## 2019-04-17 DIAGNOSIS — F419 Anxiety disorder, unspecified: Secondary | ICD-10-CM

## 2019-04-17 DIAGNOSIS — R519 Headache, unspecified: Secondary | ICD-10-CM

## 2019-04-17 DIAGNOSIS — F32A Depression, unspecified: Secondary | ICD-10-CM

## 2019-04-17 DIAGNOSIS — R51 Headache: Secondary | ICD-10-CM | POA: Diagnosis not present

## 2019-04-17 MED ORDER — TOPIRAMATE 50 MG PO TABS: 50.0000 mg | ORAL_TABLET | Freq: Every day | ORAL | 3 refills | Status: DC

## 2019-04-17 MED ORDER — SERTRALINE HCL 25 MG PO TABS: 25.0000 mg | ORAL_TABLET | Freq: Every day | ORAL | 1 refills | Status: DC
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
## 2019-04-17 NOTE — Assessment & Plan Note (Signed)
Ran out of Topamax, would like refills as she did well in the past. Refills sent to pharmacy. She will update.

## 2019-04-17 NOTE — Patient Instructions (Signed)
Start sertraline (Zoloft) 25 mg tablets once daily for anxiety and depression.   You can try Melatonin 5-10 mg daily for sleep. Take this one hour prior to bedtime.  You will be contacted regarding your referral to therapy.  Please let us know if you have not been contacted within one week.   Schedule a follow up visit with me for 6 weeks.  It was a pleasure to see you today!

## 2019-05-10 ENCOUNTER — Other Ambulatory Visit: Payer: Self-pay | Admitting: Primary Care

## 2019-05-10 DIAGNOSIS — F329 Major depressive disorder, single episode, unspecified: Secondary | ICD-10-CM

## 2019-05-10 DIAGNOSIS — F419 Anxiety disorder, unspecified: Secondary | ICD-10-CM

## 2019-05-10 DIAGNOSIS — F32A Depression, unspecified: Secondary | ICD-10-CM

## 2019-05-19 ENCOUNTER — Ambulatory Visit: Payer: BC Managed Care – PPO | Admitting: Psychology

## 2019-05-29 ENCOUNTER — Ambulatory Visit: Payer: BC Managed Care – PPO | Admitting: Primary Care

## 2019-06-06 ENCOUNTER — Ambulatory Visit: Payer: BC Managed Care – PPO | Admitting: Psychology

## 2019-06-13 ENCOUNTER — Ambulatory Visit: Payer: BC Managed Care – PPO | Admitting: Psychology

## 2019-06-14 ENCOUNTER — Encounter: Payer: Self-pay | Admitting: Obstetrics and Gynecology

## 2019-07-10 ENCOUNTER — Telehealth: Payer: Self-pay | Admitting: Obstetrics and Gynecology

## 2019-07-13 NOTE — Telephone Encounter (Signed)
error 

## 2019-08-19 ENCOUNTER — Other Ambulatory Visit: Payer: Self-pay | Admitting: Obstetrics and Gynecology

## 2019-08-20 ENCOUNTER — Other Ambulatory Visit: Payer: Self-pay | Admitting: Primary Care

## 2019-08-20 DIAGNOSIS — R519 Headache, unspecified: Secondary | ICD-10-CM

## 2019-08-20 NOTE — Telephone Encounter (Signed)
Patient needs an annual exam prior to next refill (was seeing Melody, I've also seen her in the past, but she is welcome to see any provider).

## 2019-08-22 ENCOUNTER — Telehealth: Payer: Self-pay

## 2019-08-22 NOTE — Telephone Encounter (Signed)
Patient was seeing Melody for weight loss. Says she's been reschel for each visit under Dr. Valentino Saxon. Pt wants to be

## 2019-08-23 NOTE — Telephone Encounter (Signed)
Pt has appointment for wt management medication at the end of the this month and a Annual in April 2021.

## 2019-08-25 ENCOUNTER — Encounter: Payer: 59 | Admitting: Obstetrics and Gynecology

## 2019-08-30 ENCOUNTER — Encounter: Payer: Self-pay | Admitting: Obstetrics and Gynecology

## 2019-08-31 NOTE — Progress Notes (Signed)
Pt is present to discuss starting back on the weight management program. Pt stated that she was doing well no problems.

## 2019-09-01 ENCOUNTER — Other Ambulatory Visit: Payer: Self-pay

## 2019-09-01 ENCOUNTER — Encounter: Payer: Self-pay | Admitting: Obstetrics and Gynecology

## 2019-09-01 ENCOUNTER — Ambulatory Visit: Payer: Self-pay | Admitting: Obstetrics and Gynecology

## 2019-09-01 VITALS — BP 109/75 | HR 69 | Ht 69.0 in | Wt 265.0 lb

## 2019-09-01 DIAGNOSIS — E669 Obesity, unspecified: Secondary | ICD-10-CM

## 2019-09-01 DIAGNOSIS — Z7689 Persons encountering health services in other specified circumstances: Secondary | ICD-10-CM

## 2019-09-01 MED ORDER — PHENTERMINE HCL 37.5 MG PO CAPS: 37.5000 mg | ORAL_CAPSULE | ORAL | 0 refills | Status: DC

## 2019-09-01 NOTE — Patient Instructions (Signed)
PREVENTING UNHEALTHY WEIGHT GAIN DIETARY PLAN    Restaurants  Eating out at restaurants has become a way of life for most of [place].  On an average basis, Americans eat more than 25% of their meals away from home.  When eating a meal at a restaurant, it is important to plan and think ahead about what healthy food choices are available. In addition, it is important to think that every meal out is a special occasion and to practice portion control.  Most restaurants serve portions that are 2-3 times bigger than what is considered normal.  There are several ways to control these portion sizes:  Share the meal with another person, have half of the meal bagged "to go" before eating, or eat half and leave the rest behind.  If the bread basket is your weakness, ask that it not be served.  Portion sizes also pertain to drinks. Water, diet soda, and unsweetened iced tea are the best calorie free beverages available. Multiple refills of regular soda and alcoholic beverages greatly increase total calorie intake.   The following items offer smart, low calore choice when eating at a restaurant as well as the appropriate serving sizes:  Fast Food: Fried chicken sandwich with lettuce and tomato, mayonnaise, cheese and [place] fries should be avoided.  Many salads at fast food restaurants have more calories and fat than a hamburger.  Make sure salad has grilled meat only with no cheese  Or nuts.  Fat free or light dressings should always be used.  [place] Food: 1 cup egg drop soup or 1 cup of Chinese vegetables with shrimp or tofin.  Avoid all fried food, including fried rice.  One half to 1 cup steamed white rice is also acceptable.   [place] Food: 1 small bean burrito or 2 chicken fajitas without cheese, sour cream and guacamole.  Fat free or light sour cream is allowed.  [place] Food: Spaghetti with [employer] (1 cup spaghetti, 1/2 cup sauce).  Cram sauces (alfredo) and fried foods (chicken, eggplant, veal  parmesan) should be avoided.  [place] Food: Sushi and teriyaki fish or chicken are healthy options served with steamed vegetables.  Brunch Buffet: 1 cup fruit salad, 1 cup oatmeal, 2 pancakes with light syrup, 1/2 cup scrambled eggs, toast with jelly.  Biscuits and gravy, bacon and sausage should be avoided.  [place] bacon is acceptable.  Commitment- Commitment is a critical part of any successful project and is the difference between failure and success.  It involves focusing on your goal seriously and using will power to get through the rough times.  This allows you to discover inner strengths and develop a confident and an in control new you.   Dieting is not an easy task and there are bound to be rough times.  However, if your commitment to yourself and your weight loss is strong, you will be successful in achieving your goal.  Don't break your promise to yourself.    Diet Modification:  The main components of a healthy diet include 3 small meals with 3 healthy snakes throughout each day.  Your should not go more than 4 hours without eating a small snack or meal.  This helps avoid "starvation" and decreased the urge to choose unhealthy foods. The following guidelines should be followed through the day:  Focus on portion size and control   It is important to read nutrition labels so that you are aware of the appropriate serving size  For those foods that do  not have a nutrition label, you can generally use your had as a guide for the appropriate servings sizes.  Fist 1 cup or medium whole fruit  Thumb (tip to base) 1 ounce of meat or chees  Thumb tip (tip to 1st joint) 1 tablespoon  Fingertip (tip to 1st joint) 1 teaspoon  Cupped hand 1-2 ounces of nuts, pretzels, popcorn  Palm (minus fingers) 3 ounces of meat, fish or poultry    Eat breakfast daily to increase metabolism and prevent loss of control later in the day.  Research has repeatedly shown that patients who eat breakfast daily  lose more weight and keep the weight off more effectively than patients who skip breakfast.  One serving of high fiber cereal (> 3 grams of fiber) with 1 cup of skim milk.  One serving of old fashioned oatmeal ( not instant) made with skim milk.  May add 1/2 cup chopped fruit an sugar substitute for added flavor.  Two egg omelet made with egg beaters. May add 1 tablespoon reduced fat cheese and unlimited vegetables.  One slice whole grain toast with 1 teaspoon Smart Beat margarine.  Avoid fruit juice since it has too many sugars and too little fiber. Coffee and tea fine with sugar substitute and skim or fat free dairy creamer.  Mid morning snack-choosing one of the following will help keep the craving under control.  One piece of fruit-apple, pear, banana, 1 cup of grapes  One half cup 2% cottage cheese  One container of fat free yogurt  One low fat mozzarella cheese stick  1 teaspoon natural peanut butter on celery sticks  1-2 cups air popped popcorn sprayed no more than twice with fat free margarine spray (May only have one per day)    Lunch  Start with an all vegetable salad ( no cheese or croutons with 1 teaspoon fat free or light dressing salad dressing.  Many patients find it convenient to eat a prepared meal such as Healthy [place]ince, [employer], [employer], [employer].  It is important to alternate these prepared meals with homemade meals due to their high salt content.  See dinner options for homemade meal ideas.  May add 1/2 cup rice to meal if prepared meal is not adequate  Piece of fruit.  Mid Afternoon Snack-Same as mid morning snack  Dinner  May pick one serving from each of the following categories (derived from Faulk best selling book, :"Body for Live").  Start with an all vegetable salad (no cheese or croutons) with 1 teaspoon of fat free or light salad dressing or 1 cup low fat broth based soup Good Proteins (Palm-size portion 4oz) Good  Carbohydrates (Fist-sized portion) Good Vegetables  Baked or grilled chicken breast Baked Potato Broccoli  [place] Breast Sweet Potato Asparagus  Lean ground [place] Yam Lettuce  Fish (baked, broiled, grilled, blackened allowed) [employer] ( 1/2-1 cup) Carrots  Tuna (Fresh or canned in water) Wild rice (1/2-1 cup) Cauliflower  Crab Pasta ( 1 cup whole grain) Green Beans  Shrimp Oatmeal Green Peppers  Top round steak Beans(red, pinto, black) Mushrooms   Top sirloin steak [place] representative ground beef Strawberries Tomato  Low-fat cottage cheese Whole wheat bread (1 slice regular 2 slice light) Artichoke    Cabbage    Zucchini    Cucumber    Onion    Prepare these foods using low-fat techniques such as grilling, baking, broiling, blackening meats and steaming vegetables.  Avoid canned vegetables and fruits  to decrease sodium intake.  Fresh and froze produce offer the most nutritional value  Evening Snack or "Dessert"  1/2 cup skim milk with 1 package of No sugar added hot cocoa mix  1 serving of Weight Watchers frozen desserts, Fudgesicle bar, Skinny cow ice cream treats  1 cup Sugar Free Jell-O or instant pudding topped with 1 teaspoon fat free whipped topping    1-2 cups air popped popcorn sprayed no more than twice with Fat Free Margarine Spray (may only have once per day)  Water  Eight glasses of water daily (lemon, sugar substitute, and crystal light accepted)  All regular sodas and sweetened tea are NOT allowed.  This results in taking in too many empty calories.  Two diet sodas per day are allowed as are an unlimited amount of decaffeinated coffee and tea as long as sugar substitute (Splenda, Sweet and Low, Equal) only are used for sweetening.  Water naturally suppresses the appetite and helps the body metabolize stored fat  Drinking enough water is the best treatment for fluid retention (unless you have been diagnosed with kidney deficiency).  When the body gets less  water, it senses it as a threat and holds on to the water.  It is stored in places such as the feet, legs and hands.  By drinking plenty of water, the problem of water, the problem of water retention is solved and weight loss occurs.  The above diet modifications are based on the following principles:  25-35 grams of fiber daily (found in vegetables, whole grain, oats, beans and fruits)  Choose healthy carbohydrates that are rich in fiber, vitamins, and minerals (wheat bread vs white bread, wheat pasta vs regular pasta)  Healthy fats: Choose canola or olive oil if you must use oil to cook with.  Pam cooking spray is a good substitute.  Smart Choice margarine is a healthy alternative to other margarine and butter options.  Baked goods and fried foods should be avoided to decrease calorie intake harmful trans fatty acids and saturated fat absorb in the body.  Healthy Proteins: Americans consume far too much red meat (high amount of saturated fat). Healthier sources of protein are baked or broiled fish,poultry (without skin), beans, low-fat cottage cheese, egg whites and egg substitutes.  If you choose red meat, pick lean cuts such as top round, to sirloin or round,  The following will be implemented to help assist you in achieving successful weight loss on a weekly basis:  Food Diary: Keeping a record of all food you have eaten on a daily basis helps to make you aware of your food choices and helps determine the amount you have eaten.  It also serves as a great reference to look back to see what you did well on during the weeks you were most successful.  Weekly Meal Plan: Plan your meals and go grocery shopping on a weekly basis.  Those patients that plan their meals are far more successful on reaching their goals than other patients.  Without a plan, it is easy to fall back into old eating habits.  Weekly weigh in: Patients who report weekly for weight checks do better than those that try to diet  without help.  Knowing your weight will be checked weekly helps keep you on track as well as staying motivated by the encouragement received on a weekly basis.    Exercise  You will need to begin exercising at least 5 days a week.  If you are just  beginning an exercise program, start slowly at 15-20 minutes and gradually work up to a longer period of time.  If you are tolerating the exercise well you can increase by 5 minutes each day.  You may use any form of exercise you wish (walking, aerobics, treadmill, stair stepper, elliptical machine).  If you can walk two miles in 30-40 minutes,  You are at a good pace for a successful start.   Do not over do it!!  Make sure you can carry on a conversation with someone or able to whistle while you exercise.  Once you adjust your pace and are breathing normally, step up your pace as you can tolerate it.   The following facilities in [place] area offer gym memberships:  [employer] [facility]: Only $9.00 a month  [employer]: [phone]  [facility]: [phone] ($29.00 per month)  [facility] [phone]  Medication  When medically appropriate, you will be prescribed a medication to control your appetite.  This will allow you to help follow the dietary recommendations.  Only one prescription for a 30 day supply can be given per visit, so do not misplace your prescription or medication.  It is important to remember that the purpose of the medication is to help bring your appetite under control and will be used for a short time.  You need to change your daily habits if you are to be successful in this program. If side effects such as severe headaches, nausea, and vomiting or skin rash develop, discontinue your medication and call your healthcare provider.

## 2019-09-01 NOTE — Progress Notes (Signed)
    GYNECOLOGY PROGRESS NOTE  Subjective:    Patient ID: Autumn Stanley, female    DOB: November 26, 1995, 24 y.o.   MRN: [id number]  HPI  Patient is a 24 y.o. G0P0000 female who presents for discussion of resumption of weight loss medications.  She had a trial of successful weight loss management last year with medications, dietary changes, and exercise boot camp, however had to discontinue after only a short time due to the stressful demands of a new job, and other "life issues".   She has started managing her diet, eating healthier. She also plans to begin working out again.  Drinks "plenty" of water.  Only had mild side effects from trial of phentermine last time (dry mouth, which ultimately helped her increase her water intake). Of note, patient does have a history of anxiety, which she notes she is managing well.   Her weight goal is to be under 200 lbs.     The following portions of the patient's history were reviewed and updated as appropriate: allergies, current medications, past family history, past medical history, past social history, past surgical history and problem list.  Review of Systems Pertinent items noted in HPI and remainder of comprehensive ROS otherwise negative.   Objective:   Blood pressure 109/75, pulse 69, height 5\' 9"  (1.753 m), weight 265 lb (120.2 kg), last menstrual period 08/28/2019.  Body mass index is 39.13 kg/m.  Waist circumference 52 in.  General appearance: alert and no distress Neurologic: Grossly normal Psychologic: Normal mood and affect. Normal speech.   Assessment:   Encounter for weight management Obesity (BMI 35.0-39.9 without comorbidity)   Plan:   - Patient desires to resume medications.  Will restart on Phentermine 37.5 mg.  Encouraged low carb, high protein diet and engaging in exercise at least 3 times weekly for 30 minutes.  Continue to encourage water intake, at least 64 oz per day.  - Discussed weight loss goal of 10% by end of three  months.  Advised that if by 6 months she has not shown significant weight loss, she will then need to discontinue the medication.  - RTC in 1 month for weight loss check.    07-03-1990, MD [facility]

## 2019-09-22 ENCOUNTER — Encounter: Payer: Self-pay | Admitting: Intensive Care

## 2019-09-22 ENCOUNTER — Emergency Department
Admission: EM | Admit: 2019-09-22 | Discharge: 2019-09-22 | Disposition: A | Discharge status: Home / Self Care | Payer: BC Managed Care – PPO | Attending: Emergency Medicine | Admitting: Emergency Medicine

## 2019-09-22 ENCOUNTER — Other Ambulatory Visit: Payer: Self-pay

## 2019-09-22 DIAGNOSIS — Z79899 Other long term (current) drug therapy: Secondary | ICD-10-CM | POA: Diagnosis not present

## 2019-09-22 DIAGNOSIS — N764 Abscess of vulva: Secondary | ICD-10-CM | POA: Diagnosis not present

## 2019-09-22 DIAGNOSIS — L0291 Cutaneous abscess, unspecified: Secondary | ICD-10-CM

## 2019-09-22 DIAGNOSIS — N898 Other specified noninflammatory disorders of vagina: Secondary | ICD-10-CM | POA: Diagnosis not present

## 2019-09-22 MED ORDER — LIDOCAINE HCL 1 % IJ SOLN
5.0000 mL | Freq: Once | INTRAMUSCULAR | Status: AC
  Administered 2019-09-22: 5 mL
  Filled 2019-09-22: qty 10

## 2019-09-22 MED ORDER — SULFAMETHOXAZOLE-TRIMETHOPRIM 800-160 MG PO TABS: 1.0000 | ORAL_TABLET | Freq: Two times a day (BID) | ORAL | 0 refills | Status: AC

## 2019-09-22 MED ORDER — CEPHALEXIN 500 MG PO CAPS: 500.0000 mg | ORAL_CAPSULE | Freq: Three times a day (TID) | ORAL | 0 refills | Status: AC

## 2019-09-22 MED ORDER — MORPHINE SULFATE (PF) 4 MG/ML IV SOLN
4.0000 mg | Freq: Once | INTRAVENOUS | Status: AC
  Administered 2019-09-22: 17:00:00 4 mg via INTRAMUSCULAR
  Filled 2019-09-22: qty 1

## 2019-09-22 MED ORDER — ONDANSETRON 4 MG PO TBDP
4.0000 mg | ORAL_TABLET | Freq: Once | ORAL | Status: AC
  Administered 2019-09-22: 17:00:00 4 mg via ORAL
  Filled 2019-09-22: qty 1

## 2019-09-22 NOTE — ED Triage Notes (Signed)
Patient reports abscess on right labia since 09/18/19 and started draining today

## 2019-09-22 NOTE — Discharge Instructions (Signed)
Keep wound clean and dry for the next 24 hours. Take Bactrim twice daily for the next 7 days. Take Keflex 3 times daily for the next 7 days. Return to the emergency department for new or worsening symptoms Remove packing in 2 days if packing does not fall out spontaneously.
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
## 2019-09-22 NOTE — ED Notes (Signed)
Patient was escorted to car. Patient indicated understanding of discharge instructions by asking appropriate questions.

## 2019-09-22 NOTE — ED Provider Notes (Signed)
Emergency Department Provider Note  ____________________________________________  Time seen: Approximately 6:02 PM  I have reviewed the triage vital signs and the nursing notes.   HISTORY  Chief Complaint Abscess   Historian Patient     HPI Autumn Stanley is a 24 y.o. female with a history of folliculitis, presents to the emergency department with right labial swelling and erythema for the past 4 days.  Patient denies fever at home.  Patient states that she noticed spontaneous drainage today. Patient states that she came to ED due to severity of discomfort and swelling.  No other alleviating measures have been attempted.   Past Medical History:  Diagnosis Date  . Frequent headaches   . Kidney stone   . Migraine      Immunizations up to date:  Yes.     Past Medical History:  Diagnosis Date  . Frequent headaches   . Kidney stone   . Migraine     Patient Active Problem List   Diagnosis Date Noted  . Boils 12/09/2017  . Anxiety and depression 05/26/2016  . Irregular periods 05/26/2016  . Frequent headaches 07/19/2015    Past Surgical History:  Procedure Laterality Date  . MOUTH SURGERY  2016    Prior to Admission medications   Medication Sig Start Date End Date Taking? Authorizing Provider  cephALEXin (KEFLEX) 500 MG capsule Take 1 capsule (500 mg total) by mouth 3 (three) times daily for 7 days. 09/22/19 09/29/19  Lannie Fields, PA-C  phentermine 37.5 MG capsule Take 1 capsule (37.5 mg total) by mouth every morning. 09/01/19   Rubie Maid, MD  [place] 28 0.25-35 MG-MCG tablet TAKE 1 TABLET BY MOUTH EVERY DAY 08/20/19   Rubie Maid, MD  sulfamethoxazole-trimethoprim (BACTRIM DS) 800-160 MG tablet Take 1 tablet by mouth 2 (two) times daily for 7 days. 09/22/19 09/29/19  Lannie Fields, PA-C  topiramate (TOPAMAX) 50 MG tablet Take 1 tablet (50 mg total) by mouth at bedtime. For headache prevention. 04/17/19   Pleas Koch, NP    Allergies Patient has  no known allergies.  Family History  Problem Relation Age of Onset  . Arthritis Mother   . Breast cancer Mother   . Arthritis Maternal Grandmother   . Heart disease Maternal Grandmother   . Hyperlipidemia Maternal Grandmother   . Breast cancer Maternal Grandmother   . Prostate cancer Maternal Grandfather   . Hyperlipidemia Maternal Grandfather   . Hyperlipidemia Paternal Grandmother   . Hyperlipidemia Paternal Grandfather   . Hypertension Father     Social History Social History   Tobacco Use  . Smoking status: Never Smoker  . Smokeless tobacco: Never Used  Substance Use Topics  . Alcohol use: Yes    Comment: Socially   . Drug use: No     Review of Systems  Constitutional: No fever/chills Eyes:  No discharge ENT: No upper respiratory complaints. Respiratory: no cough. No SOB/ use of accessory muscles to breath Gastrointestinal:   No nausea, no vomiting.  No diarrhea.  No constipation. Genitourinary: Patient has right sided labial swelling and erythema.  Musculoskeletal: Negative for musculoskeletal pain. Skin: Negative for rash, abrasions, lacerations, ecchymosis.    ____________________________________________   PHYSICAL EXAM:  VITAL SIGNS: ED Triage Vitals  Enc Vitals Group     BP 09/22/19 1618 119/79     Pulse Rate 09/22/19 1618 (!) 115     Resp 09/22/19 1618 14     Temp 09/22/19 1618 98.2 F (36.8 C)  Temp Source 09/22/19 1618 Oral     SpO2 09/22/19 1618 100 %     Weight 09/22/19 1612 250 lb (113.4 kg)     Height 09/22/19 1612 5\' 9"  (1.753 m)     Head Circumference --      Peak Flow --      Pain Score 09/22/19 1611 8     Pain Loc --      Pain Edu? --      Excl. in GC? --      Constitutional: Alert and oriented. Well appearing and in no acute distress. Eyes: Conjunctivae are normal. PERRL. EOMI. Head: Atraumatic. Cardiovascular: Normal rate, regular rhythm. Normal S1 and S2.  Good peripheral circulation. Respiratory: Normal respiratory  effort without tachypnea or retractions. Lungs CTAB. Good air entry to the bases with no decreased or absent breath sounds Gastrointestinal: Bowel sounds x 4 quadrants. Soft and nontender to palpation. No guarding or rigidity. No distention. Genitourinary: Patient has significant swelling of the right labia with associated induration and surrounding cellulitis. Musculoskeletal: Full range of motion to all extremities. No obvious deformities noted Neurologic:  Normal for age. No gross focal neurologic deficits are appreciated.  Skin:  Skin is warm, dry and intact. No rash noted. Psychiatric: Mood and affect are normal for age. Speech and behavior are normal.   ____________________________________________   LABS (all labs ordered are listed, but only abnormal results are displayed)  Labs Reviewed - No data to display ____________________________________________  EKG   ____________________________________________  RADIOLOGY 09/24/19, personally viewed and evaluated these images (plain radiographs) as part of my medical decision making, as well as reviewing the written report by the radiologist.  Bedside ultrasound was conducted in the emergency department which revealed significant drainable fluid collection.  No results found.  ____________________________________________    PROCEDURES  Procedure(s) performed:     Geraldo PitterMarland KitchenIncision and Drainage  Date/Time: 09/22/2019 6:05 PM Performed by: 09/24/2019, PA-C Authorized by: Orvil Feil, PA-C   Consent:    Consent obtained:  Verbal   Consent given by:  Patient   Risks discussed:  Bleeding, infection, incomplete drainage and pain   Alternatives discussed:  Alternative treatment, delayed treatment and observation Location:    Type:  Abscess   Location:  Trunk Pre-procedure details:    Skin preparation:  Betadine Anesthesia (see MAR for exact dosages):    Anesthesia method:  Local infiltration   Local  anesthetic:  Lidocaine 1% w/o epi Procedure type:    Complexity:  Complex Procedure details:    Incision types:  Single straight   Scalpel blade:  11   Wound management:  Probed and deloculated   Drainage:  Purulent   Drainage amount:  Moderate   Wound treatment:  Wound left open   Packing materials:  1/4 in gauze   Amount 1/4":  2 Post-procedure details:    Patient tolerance of procedure:  Tolerated well, no immediate complications       Medications  morphine 4 MG/ML injection 4 mg (4 mg Intramuscular Given 09/22/19 1706)  ondansetron (ZOFRAN-ODT) disintegrating tablet 4 mg (4 mg Oral Given 09/22/19 1705)  lidocaine (XYLOCAINE) 1 % (with pres) injection 5 mL (5 mLs Infiltration Given by Other 09/22/19 1700)     ____________________________________________   INITIAL IMPRESSION / ASSESSMENT AND PLAN / ED COURSE  Pertinent labs & imaging results that were available during my care of the patient were reviewed by me and considered in my medical decision making (see chart  for details).      Assessment and plan Labial cellulitis Labial abscess  24 year old female presents to the emergency department with labial erythema and edema since 09/17/2018.  Patient was tachycardic at triage.  Bedside ultrasound was obtained which revealed a drainable fluid collection of right labia.  Patient underwent incision and drainage without complication she was discharged with Bactrim and Keflex.  Return precautions were given to return to the emergency department with new or worsening symptoms.  All patient questions were answered.   ____________________________________________  FINAL CLINICAL IMPRESSION(S) / ED DIAGNOSES  Final diagnoses:  Abscess      NEW MEDICATIONS STARTED DURING THIS VISIT:  ED Discharge Orders         Ordered    sulfamethoxazole-trimethoprim (BACTRIM DS) 800-160 MG tablet  2 times daily     09/22/19 1738    cephALEXin (KEFLEX) 500 MG capsule  3 times daily      09/22/19 1738              This chart was dictated using voice recognition software/Dragon. Despite best efforts to proofread, errors can occur which can change the meaning. Any change was purely unintentional.     Orvil Feil, PA-C 09/22/19 1807    Phineas Semen, MD 09/22/19 [place]

## 2019-10-13 DIAGNOSIS — Z1289 Encounter for screening for malignant neoplasm of other sites: Secondary | ICD-10-CM | POA: Diagnosis not present

## 2019-10-13 DIAGNOSIS — K027 Dental root caries: Secondary | ICD-10-CM | POA: Diagnosis not present

## 2019-10-13 DIAGNOSIS — F418 Other specified anxiety disorders: Secondary | ICD-10-CM | POA: Diagnosis not present

## 2019-10-13 DIAGNOSIS — R52 Pain, unspecified: Secondary | ICD-10-CM | POA: Diagnosis not present

## 2019-11-05 ENCOUNTER — Other Ambulatory Visit: Payer: Self-pay

## 2019-11-08 ENCOUNTER — Other Ambulatory Visit: Payer: Self-pay

## 2019-11-08 NOTE — Telephone Encounter (Signed)
Please advise. Thanks Skyley Grandmaison 

## 2019-11-10 ENCOUNTER — Encounter: Payer: Self-pay | Admitting: Obstetrics and Gynecology

## 2019-11-14 ENCOUNTER — Other Ambulatory Visit: Payer: Self-pay

## 2019-11-14 ENCOUNTER — Ambulatory Visit: Payer: BC Managed Care – PPO | Admitting: Obstetrics and Gynecology

## 2019-11-14 ENCOUNTER — Encounter: Payer: Self-pay | Admitting: Obstetrics and Gynecology

## 2019-11-14 VITALS — BP 106/69 | HR 76 | Ht 69.0 in | Wt 252.8 lb

## 2019-11-14 DIAGNOSIS — Z3041 Encounter for surveillance of contraceptive pills: Secondary | ICD-10-CM

## 2019-11-14 DIAGNOSIS — Z7689 Persons encountering health services in other specified circumstances: Secondary | ICD-10-CM

## 2019-11-14 DIAGNOSIS — E669 Obesity, unspecified: Secondary | ICD-10-CM

## 2019-11-14 DIAGNOSIS — Z8669 Personal history of other diseases of the nervous system and sense organs: Secondary | ICD-10-CM

## 2019-11-14 MED ORDER — PHENTERMINE HCL 37.5 MG PO CAPS: 37.5000 mg | ORAL_CAPSULE | ORAL | 0 refills | Status: DC

## 2019-11-14 MED ORDER — TOPIRAMATE 50 MG PO TABS: 50.0000 mg | ORAL_TABLET | Freq: Every day | ORAL | 0 refills | Status: DC

## 2019-11-14 MED ORDER — SPRINTEC 28 0.25-35 MG-MCG PO TABS: 1.0000 | ORAL_TABLET | Freq: Every day | ORAL | 0 refills | Status: DC

## 2019-11-14 NOTE — Progress Notes (Signed)
Pt present for weight management. Pt's last visit 09/01/2019 weight 265lb. Pt stated that she taking the medication phentermine as prescribed without any noted side effects.

## 2019-11-14 NOTE — Progress Notes (Signed)
    GYNECOLOGY PROGRESS NOTE  Subjective:    Patient ID: Autumn Stanley, female    DOB: 10-05-1995, 24 y.o.   MRN: [id number]  HPI  Patient is a 24 y.o. female who presents for 2 month weight management follow up. She initiated use of Phentermine for 1 month. Also uses Topamax for her migraines, but can be an adjunct to weight loss (off label). Denies any undesirable side effects and reports compliance with medications. She does report increased thirst but this helps with drinking more water.    Current interventions:  1. Diet - Fruit, protein powder, for breakfast. Salads with protein for lunch, dinner is protein with veggies. Has been doing this since March. Water intake is usually 1/2 gallon per day. Notes cravings are at a minimum.  Appetite is controlled, food is filling.  2. Activity - began working out consistently in March. Burn boot camp daily (45-50 minutes of high intensity 5-6 days per week) 3. Reports bowel movements are every 1-2 days, firm but no straining required.    She also reports that she desires a refill on her birth control, and her Topamax. Notes she had to reschedule her annual exam, and now is not until July.   The following portions of the patient's history were reviewed and updated as appropriate: allergies, current medications, past family history, past medical history, past social history, past surgical history and problem list.  Review of Systems Pertinent items noted in HPI and remainder of comprehensive ROS otherwise negative.   Objective:    Vitals with BMI 11/14/2019 09/22/2019 09/01/2019  Height 5\' 9"  5\' 9"  5\' 9"   Weight 252 lbs 13 oz 250 lbs 265 lbs  BMI 37.31 36.9 39.12  Systolic 106 119  Diastolic 69 79 75  Pulse 76 115 69    General appearance: alert and no distress  Lungs: clear to auscultation bilaterally Heart: regular rate and rhythm, S1, S2 normal, no murmur, click, rub or gallop Abdomen: soft, non-tender.  Waist circumference 45 in.     Labs:  No new labs Assessment:   Weight management Obesity, Body mass index is 37.33 kg/m.  History of migraines Contraception management  Plan:   1. Can continue Phentermine, has lost 13 lbs with aid of dietary modification and exercise. Advised to continue to increase water weight.  Slight increase in weight recently (2 lbs) likely due to building muscle mass due to increasing protein intake and exercising.  Will refill prescription. Continue to advise weight loss goal of 10% by end of three months (total of 26.5 lbs). Patien'ts ultimate goal is to be under 200 lbs (at this point she will also be out of the obesity category).  2.  Will refill OCPs, given 3 month refill until annual exam.  3. 3. Refilled Topamax for now, however encouraged patient to f/u with her PCP for further refills. Also informed patient of possibility of decreased efficacy of her OCP (however usually noted at higher doses of mediation)>  4. RTC in 1 month for weight check, nurse visit.    , MD [facility]

## 2019-12-06 ENCOUNTER — Encounter: Payer: Self-pay | Admitting: Obstetrics and Gynecology

## 2019-12-21 ENCOUNTER — Ambulatory Visit (INDEPENDENT_AMBULATORY_CARE_PROVIDER_SITE_OTHER): Payer: BC Managed Care – PPO | Admitting: Obstetrics and Gynecology

## 2019-12-21 ENCOUNTER — Other Ambulatory Visit: Payer: Self-pay

## 2019-12-21 VITALS — BP 117/78 | HR 73 | Ht 69.0 in | Wt 237.0 lb

## 2019-12-21 DIAGNOSIS — Z7689 Persons encountering health services in other specified circumstances: Secondary | ICD-10-CM

## 2019-12-21 NOTE — Progress Notes (Signed)
Pt present for routine weight management. Pt stated having one side effect of the medication phenetamine which is she is thirsty all the time. Waist measurement 46. Pt needs refill of phenetamine.

## 2019-12-25 ENCOUNTER — Other Ambulatory Visit: Payer: Self-pay

## 2019-12-28 ENCOUNTER — Other Ambulatory Visit: Payer: Self-pay

## 2019-12-28 NOTE — Telephone Encounter (Signed)
Please advise. Thanks Jevaeh Shams 

## 2019-12-29 MED ORDER — PHENTERMINE HCL 37.5 MG PO CAPS: 37.5000 mg | ORAL_CAPSULE | ORAL | 0 refills | Status: DC

## 2020-01-25 ENCOUNTER — Telehealth: Payer: Self-pay | Admitting: Obstetrics and Gynecology

## 2020-01-25 NOTE — Telephone Encounter (Signed)
Contacted patient to inform her we had to change her appointment dates due to provider being out of the office. LVM for patient to call office.

## 2020-02-27 ENCOUNTER — Encounter: Payer: Self-pay | Admitting: Obstetrics and Gynecology

## 2020-03-03 ENCOUNTER — Other Ambulatory Visit: Payer: Self-pay

## 2020-03-08 MED ORDER — SPRINTEC 28 0.25-35 MG-MCG PO TABS: 1.0000 | ORAL_TABLET | Freq: Every day | ORAL | 0 refills | Status: DC

## 2020-04-23 ENCOUNTER — Encounter: Payer: Self-pay | Admitting: Obstetrics and Gynecology

## 2020-05-31 ENCOUNTER — Other Ambulatory Visit: Payer: Self-pay | Admitting: Obstetrics and Gynecology

## 2020-06-20 ENCOUNTER — Encounter: Payer: Self-pay | Admitting: Obstetrics and Gynecology

## 2020-10-16 ENCOUNTER — Encounter: Payer: Self-pay | Admitting: Obstetrics and Gynecology

## 2020-12-23 NOTE — Patient Instructions (Addendum)
Preventive Care 21-26 Years Old, Female Preventive care refers to lifestyle choices and visits with your health care provider that can promote health and wellness. This includes:  A yearly physical exam. This is also called an annual wellness visit.  Regular dental and eye exams.  Immunizations.  Screening for certain conditions.  Healthy lifestyle choices, such as: ? Eating a healthy diet. ? Getting regular exercise. ? Not using drugs or products that contain nicotine and tobacco. ? Limiting alcohol use. What can I expect for my preventive care visit? Physical exam Your health care provider may check your:  Height and weight. These may be used to calculate your BMI (body mass index). BMI is a measurement that tells if you are at a healthy weight.  Heart rate and blood pressure.  Body temperature.  Skin for abnormal spots. Counseling Your health care provider may ask you questions about your:  Past medical problems.  Family's medical history.  Alcohol, tobacco, and drug use.  Emotional well-being.  Home life and relationship well-being.  Sexual activity.  Diet, exercise, and sleep habits.  Work and work environment.  Access to firearms.  Method of birth control.  Menstrual cycle.  Pregnancy history. What immunizations do I need? Vaccines are usually given at various ages, according to a schedule. Your health care provider will recommend vaccines for you based on your age, medical history, and lifestyle or other factors, such as travel or where you work.   What tests do I need? Blood tests  Lipid and cholesterol levels. These may be checked every 5 years starting at age 20.  Hepatitis C test.  Hepatitis B test. Screening  Diabetes screening. This is done by checking your blood sugar (glucose) after you have not eaten for a while (fasting).  STD (sexually transmitted disease) testing, if you are at risk.  BRCA-related cancer screening. This may be  done if you have a family history of breast, ovarian, tubal, or peritoneal cancers.  Pelvic exam and Pap test. This may be done every 3 years starting at age 21. Starting at age 30, this may be done every 5 years if you have a Pap test in combination with an HPV test. Talk with your health care provider about your test results, treatment options, and if necessary, the need for more tests.   Follow these instructions at home: Eating and drinking  Eat a healthy diet that includes fresh fruits and vegetables, whole grains, lean protein, and low-fat dairy products.  Take vitamin and mineral supplements as recommended by your health care provider.  Do not drink alcohol if: ? Your health care provider tells you not to drink. ? You are pregnant, may be pregnant, or are planning to become pregnant.  If you drink alcohol: ? Limit how much you have to 0-1 drink a day. ? Be aware of how much alcohol is in your drink. In the [place] one drink equals one 12 oz bottle of beer (355 mL), one 5 oz glass of wine (148 mL), or one 1 oz glass of hard liquor (44 mL).   Lifestyle  Take daily care of your teeth and gums. Brush your teeth every morning and night with fluoride toothpaste. Floss one time each day.  Stay active. Exercise for at least 30 minutes 5 or more days each week.  Do not use any products that contain nicotine or tobacco, such as cigarettes, e-cigarettes, and chewing tobacco. If you need help quitting, ask your health care provider.  Do not   use drugs.  If you are sexually active, practice safe sex. Use a condom or other form of protection to prevent STIs (sexually transmitted infections).  If you do not wish to become pregnant, use a form of birth control. If you plan to become pregnant, see your health care provider for a prepregnancy visit.  Find healthy ways to cope with stress, such as: ? Meditation, yoga, or listening to music. ? Journaling. ? Talking to a trusted  person. ? Spending time with friends and family. Safety  Always wear your seat belt while driving or riding in a vehicle.  Do not drive: ? If you have been drinking alcohol. Do not ride with someone who has been drinking. ? When you are tired or distracted. ? While texting.  Wear a helmet and other protective equipment during sports activities.  If you have firearms in your house, make sure you follow all gun safety procedures.  Seek help if you have been physically or sexually abused. What's next?  Go to your health care provider once a year for an annual wellness visit.  Ask your health care provider how often you should have your eyes and teeth checked.  Stay up to date on all vaccines. This information is not intended to replace advice given to you by your health care provider. Make sure you discuss any questions you have with your health care provider. Document Revised: 03/17/2020 Document Reviewed: 03/31/2018 Elsevier Patient Education  2021 [place] Breast self-awareness is knowing how your breasts look and feel. Doing breast self-awareness is important. It allows you to catch a breast problem early while it is still small and can be treated. All women should do breast self-awareness, including women who have had breast implants. Tell your doctor if you notice a change in your breasts. What you need:  A mirror.  A well-lit room. How to do a breast self-exam A breast self-exam is one way to learn what is normal for your breasts and to check for changes. To do a breast self-exam: Look for changes 1. Take off all the clothes above your waist. 2. Stand in front of a mirror in a room with good lighting. 3. Put your hands on your hips. 4. Push your hands down. 5. Look at your breasts and nipples in the mirror to see if one breast or nipple looks different from the other. Check to see if: ? The shape of one breast is different. ? The size of one  breast is different. ? There are wrinkles, dips, and bumps in one breast and not the other. 6. Look at each breast for changes in the skin, such as: ? Redness. ? Scaly areas. 7. Look for changes in your nipples, such as: ? Liquid around the nipples. ? Bleeding. ? Dimpling. ? Redness. ? A change in where the nipples are.   Feel for changes 1. Lie on your back on the floor. 2. Feel each breast. To do this, follow these steps: ? Pick a breast to feel. ? Put the arm closest to that breast above your head. ? Use your other arm to feel the nipple area of your breast. Feel the area with the pads of your three middle fingers by making small circles with your fingers. For the first circle, press lightly. For the second circle, press harder. For the third circle, press even harder. ? Keep making circles with your fingers at the different pressures as you move down your breast. Stop when  you feel your ribs. ? Move your fingers a little toward the center of your body. ? Start making circles with your fingers again, this time going up until you reach your collarbone. ? Keep making up-and-down circles until you reach your armpit. Remember to keep using the three pressures. ? Feel the other breast in the same way. 3. Sit or stand in the tub or shower. 4. With soapy water on your skin, feel each breast the same way you did in step 2 when you were lying on the floor.   Write down what you find Writing down what you find can help you remember what to tell your doctor. Write down:  What is normal for each breast.  Any changes you find in each breast, including: ? The kind of changes you find. ? Whether you have pain. ? Size and location of any lumps.  When you last had your menstrual period. General tips  Check your breasts every month.  If you are breastfeeding, the best time to check your breasts is after you feed your baby or after you use a breast pump.  If you get menstrual periods, the  best time to check your breasts is 5-7 days after your menstrual period is over.  With time, you will become comfortable with the self-exam, and you will begin to know if there are changes in your breasts. Contact a doctor if you:  See a change in the shape or size of your breasts or nipples.  See a change in the skin of your breast or nipples, such as red or scaly skin.  Have fluid coming from your nipples that is not normal.  Find a lump or thick area that was not there before.  Have pain in your breasts.  Have any concerns about your breast health. Summary  Breast self-awareness includes looking for changes in your breasts, as well as feeling for changes within your breasts.  Breast self-awareness should be done in front of a mirror in a well-lit room.  You should check your breasts every month. If you get menstrual periods, the best time to check your breasts is 5-7 days after your menstrual period is over.  Let your doctor know of any changes you see in your breasts, including changes in size, changes on the skin, pain or tenderness, or fluid from your nipples that is not normal. This information is not intended to replace advice given to you by your health care provider. Make sure you discuss any questions you have with your health care provider. Document Revised: 03/08/2018 Document Reviewed: 03/08/2018 Elsevier Patient Education  2021 [employer]  Preventive Care 21-26 Years Old, Female Preventive care refers to lifestyle choices and visits with your health care provider that can promote health and wellness. This includes:  A yearly physical exam. This is also called an annual wellness visit.  Regular dental and eye exams.  Immunizations.  Screening for certain conditions.  Healthy lifestyle choices, such as: ? Eating a healthy diet. ? Getting regular exercise. ? Not using drugs or products that contain nicotine and tobacco. ? Limiting alcohol use. What can I  expect for my preventive care visit? Physical exam Your health care provider may check your:  Height and weight. These may be used to calculate your BMI (body mass index). BMI is a measurement that tells if you are at a healthy weight.  Heart rate and blood pressure.  Body temperature.  Skin for abnormal spots. Counseling Your health care   provider may ask you questions about your:  Past medical problems.  Family's medical history.  Alcohol, tobacco, and drug use.  Emotional well-being.  Home life and relationship well-being.  Sexual activity.  Diet, exercise, and sleep habits.  Work and work environment.  Access to firearms.  Method of birth control.  Menstrual cycle.  Pregnancy history. What immunizations do I need? Vaccines are usually given at various ages, according to a schedule. Your health care provider will recommend vaccines for you based on your age, medical history, and lifestyle or other factors, such as travel or where you work.   What tests do I need? Blood tests  Lipid and cholesterol levels. These may be checked every 5 years starting at age 20.  Hepatitis C test.  Hepatitis B test. Screening  Diabetes screening. This is done by checking your blood sugar (glucose) after you have not eaten for a while (fasting).  STD (sexually transmitted disease) testing, if you are at risk.  BRCA-related cancer screening. This may be done if you have a family history of breast, ovarian, tubal, or peritoneal cancers.  Pelvic exam and Pap test. This may be done every 3 years starting at age 21. Starting at age 30, this may be done every 5 years if you have a Pap test in combination with an HPV test. Talk with your health care provider about your test results, treatment options, and if necessary, the need for more tests.   Follow these instructions at home: Eating and drinking  Eat a healthy diet that includes fresh fruits and vegetables, whole grains, lean  protein, and low-fat dairy products.  Take vitamin and mineral supplements as recommended by your health care provider.  Do not drink alcohol if: ? Your health care provider tells you not to drink. ? You are pregnant, may be pregnant, or are planning to become pregnant.  If you drink alcohol: ? Limit how much you have to 0-1 drink a day. ? Be aware of how much alcohol is in your drink. In the [place] one drink equals one 12 oz bottle of beer (355 mL), one 5 oz glass of wine (148 mL), or one 1 oz glass of hard liquor (44 mL).   Lifestyle  Take daily care of your teeth and gums. Brush your teeth every morning and night with fluoride toothpaste. Floss one time each day.  Stay active. Exercise for at least 30 minutes 5 or more days each week.  Do not use any products that contain nicotine or tobacco, such as cigarettes, e-cigarettes, and chewing tobacco. If you need help quitting, ask your health care provider.  Do not use drugs.  If you are sexually active, practice safe sex. Use a condom or other form of protection to prevent STIs (sexually transmitted infections).  If you do not wish to become pregnant, use a form of birth control. If you plan to become pregnant, see your health care provider for a prepregnancy visit.  Find healthy ways to cope with stress, such as: ? Meditation, yoga, or listening to music. ? Journaling. ? Talking to a trusted person. ? Spending time with friends and family. Safety  Always wear your seat belt while driving or riding in a vehicle.  Do not drive: ? If you have been drinking alcohol. Do not ride with someone who has been drinking. ? When you are tired or distracted. ? While texting.  Wear a helmet and other protective equipment during sports activities.  If you have firearms   in your house, make sure you follow all gun safety procedures.  Seek help if you have been physically or sexually abused. What's next?  Go to your health care provider  once a year for an annual wellness visit.  Ask your health care provider how often you should have your eyes and teeth checked.  Stay up to date on all vaccines. This information is not intended to replace advice given to you by your health care provider. Make sure you discuss any questions you have with your health care provider. Document Revised: 03/17/2020 Document Reviewed: 03/31/2018 Elsevier Patient Education  2021 [place] Breast self-awareness means being familiar with how your breasts look and feel. It involves checking your breasts regularly and reporting any changes to your health care provider. Practicing breast self-awareness is important. Sometimes changes may not be harmful (are benign), but sometimes a change in your breasts can be a sign of a serious medical problem. It is important to learn how to do this procedure correctly so that you can catch problems early, when treatment is more likely to be successful. All women should practice breast self-awareness, including women who have had breast implants. What you need:  A mirror.  A well-lit room. How to do a breast self-exam A breast self-exam is one way to learn what is normal for your breasts and whether your breasts are changing. To do a breast self-exam: Look for changes 1. Remove all the clothing above your waist. 2. Stand in front of a mirror in a room with good lighting. 3. Put your hands on your hips. 4. Push your hands firmly downward. 5. Compare your breasts in the mirror. Look for differences between them (asymmetry), such as: ? Differences in shape. ? Differences in size. ? Puckers, dips, and bumps in one breast and not the other. 6. Look at each breast for changes in the skin, such as: ? Redness. ? Scaly areas. 7. Look for changes in your nipples, such as: ? Discharge. ? Bleeding. ? Dimpling. ? Redness. ? A change in position.   Feel for changes Carefully feel your  breasts for lumps and changes. It is best to do this while lying on your back on the floor, and again while sitting or standing in the tub or shower with soapy water on your skin. Feel each breast in the following way: 1. Place the arm on the side of the breast you are examining above your head. 2. Feel your breast with the other hand. 3. Start in the nipple area and make -inch (2 cm) overlapping circles to feel your breast. Use the pads of your three middle fingers to do this. Apply light pressure, then medium pressure, then firm pressure. The light pressure will allow you to feel the tissue closest to the skin. The medium pressure will allow you to feel the tissue that is a little deeper. The firm pressure will allow you to feel the tissue close to the ribs. 4. Continue the overlapping circles, moving downward over the breast until you feel your ribs below your breast. 5. Move one finger-width toward the center of the body. Continue to use the -inch (2 cm) overlapping circles to feel your breast as you move slowly up toward your collarbone. 6. Continue the up-and-down exam using all three pressures until you reach your armpit.   Write down what you find Writing down what you find can help you remember what to discuss with your health  care provider. Write down:  What is normal for each breast.  Any changes that you find in each breast, including: ? The kind of changes you find. ? Any pain or tenderness. ? Size and location of any lumps.  Where you are in your menstrual cycle, if you are still menstruating. General tips and recommendations  Examine your breasts every month.  If you are breastfeeding, the best time to examine your breasts is after a feeding or after using a breast pump.  If you menstruate, the best time to examine your breasts is 5-7 days after your period. Breasts are generally lumpier during menstrual periods, and it may be more difficult to notice changes.  With time  and practice, you will become more familiar with the variations in your breasts and more comfortable with the exam. Contact a health care provider if you:  See a change in the shape or size of your breasts or nipples.  See a change in the skin of your breast or nipples, such as a reddened or scaly area.  Have unusual discharge from your nipples.  Find a lump or thick area that was not there before.  Have pain in your breasts.  Have any concerns related to your breast health. Summary  Breast self-awareness includes looking for physical changes in your breasts, as well as feeling for any changes within your breasts.  Breast self-awareness should be performed in front of a mirror in a well-lit room.  You should examine your breasts every month. If you menstruate, the best time to examine your breasts is 5-7 days after your menstrual period.  Let your health care provider know of any changes you notice in your breasts, including changes in size, changes on the skin, pain or tenderness, or unusual fluid from your nipples. This information is not intended to replace advice given to you by your health care provider. Make sure you discuss any questions you have with your health care provider. Document Revised: 03/08/2018 Document Reviewed: 03/08/2018 Elsevier Patient Education  2021 [place].    Preparing for Pregnancy If you are planning to become pregnant, talk to your health care provider about preconception care. This type of care helps you prepare for a safe and healthy pregnancy. During this visit, your health care provider will:  Do a complete physical exam, including a Pap test.  Take your complete medical history.  Give you information, answer your questions, and help you resolve problems. Preconception checklist Medical history  Tell your health care provider about any medical conditions you have or have had. Your pregnancy or your ability to become pregnant may be  affected by long-term (chronic) conditions, such as: ? Diabetes. ? High blood pressure (hypertension). ? Thyroid problems.  Tell your health care provider about your family's medical history and your partner's medical history.  Tell your health care provider if you have or have had any sexually transmitted infections, orSTIs. These can affect your pregnancy. In some cases, they can be passed to your baby.  If needed, discuss the benefits of genetic testing. This test checks for conditions that may be passed from parent to child.  Tell your health care provider about: ? Any problems you had getting pregnant or while pregnant. ? Any medicines you take. These include vitamins, herbal supplements, and over-the-counter medicines. ? Your history of getting vaccines. Discuss any vaccines that you may need. Diet  Ask your health care provider about what foods to eat in order to get a balance  of nutrients. This is especially important when you are pregnant or preparing to become pregnant. It is recommended that women of childbearing age take a folic acid supplement of 400 mcg daily and eat foods rich in folic acid to prevent certain birth defects.  Ask your health care provider to help you reach a healthy weight before pregnancy. ? If you are overweight, you may have a higher risk for certain problems. These include hypertension, diabetes, and early (preterm) birth. ? If you are underweight, you are more likely to have a baby who has a low birth weight. Lifestyle, work, and home Let your health care provider know about:  Any lifestyle habits that you have, such as use of alcohol, drugs, or tobacco products.  Fun and leisure activities that may put you at risk during pregnancy, such as downhill skiing and certain exercise programs.  Any plans to travel out of the country, especially to places with an active [place] virus outbreak.  Harmful substances that you may be exposed to at work or at home.  These include chemicals, pesticides, radiation, and substances from cat litter boxes.  Any concerns you have for your safety at home. Mental health Tell your health care provider about:  Any history of mental health conditions, including feelings of depression, sadness, or anxiety.  Any medicines that you take for a mental health condition. These include herbs and supplements. How do I know that I am pregnant? You may be pregnant if you have been sexually active and you miss your period. Other symptoms of early pregnancy include:  Mild cramping.  Very light vaginal bleeding (spotting).  Feeling more tired than usual.  Nausea and vomiting. These may be signs of morning sickness. Take a home pregnancy test if you have any of these symptoms. This test checks for a hormone in your urine called human chorionic gonadotropin, or hCG. A woman's body begins to make this hormone during early pregnancy. These tests are very accurate. Wait until at least the first day after you miss your period to take a home pregnancy test. If the test shows that you are pregnant, call your health care provider for a prenatal care visit. What should I do if I become pregnant?  Schedule a visit with your health care provider as soon as you suspect you are pregnant.  Talk to your health care provider if you are taking prescription medicines to determine if they are safe to take during pregnancy.  You may continue to have sex if it does not cause pain or other problems, such as vaginal bleeding. Follow these instructions at home: Eating and drinking  Follow instructions from your health care provider about eating or drinking restrictions.  Drink enough fluid to keep your urine pale yellow.  Eat a balanced diet. This includes fresh fruits and vegetables, whole grains, lean meats, low-fat dairy products, healthy fats, and foods that are high in fiber. Ask to meet with a nutritionist or registered dietitian for  help with meal planning and goals.  Avoid eating raw or undercooked meat and seafood.  Avoid eating or drinking unpasteurized dairy products.   Lifestyle  Get regular exercise. Try to be active for at least 30 minutes a day on most days of the week. Ask your health care provider which activities are safe during pregnancy.  Maintain a healthy weight.  Avoid toxic fumes and chemicals.  Avoid cleaning cat litter boxes. Cat feces may contain a harmful parasite called toxoplasma.  Avoid travel to countries  where [place] virus is common.  Do not use any products that contain nicotine or tobacco, such as cigarettes, e-cigarettes, and chewing tobacco. If you need help quitting, ask your health care provider.  Do not drink alcohol or use drugs.      General instructions  Keep an accurate record of your menstrual periods. This makes it easier for your health care provider to determine your baby's due date.  Take over-the-counter and prescription medicines only as told by your health care provider.  Begin taking prenatal vitamins and folic acid supplements daily as directed.  Manage any chronic conditions, such as hypertension and diabetes, as told by your health care provider. This is important. Summary  If you are planning to become pregnant, talk to your health care provider about preconception care. This is an important part of planning for a healthy pregnancy.  Women of childbearing age should take 540 mcg of folic acid daily in addition to eating a diet rich in folic acid. This will prevent certain birth defects.  Schedule a visit with your health care provider as soon as you suspect you are pregnant. Tell your health care provider about your medical history, lifestyle activities, home safety, and other things that may concern you. This information is not intended to replace advice given to you by your health care provider. Make sure you discuss any questions you have with your health  care provider. Document Revised: 04/19/2019 Document Reviewed: 04/19/2019 Elsevier Patient Education  [place].

## 2020-12-24 ENCOUNTER — Other Ambulatory Visit (HOSPITAL_COMMUNITY)
Admission: RE | Admit: 2020-12-24 | Discharge: 2020-12-24 | Disposition: A | Discharge status: Home / Self Care | Payer: BC Managed Care – PPO | Source: Ambulatory Visit | Attending: Obstetrics and Gynecology | Admitting: Obstetrics and Gynecology

## 2020-12-24 ENCOUNTER — Ambulatory Visit (INDEPENDENT_AMBULATORY_CARE_PROVIDER_SITE_OTHER): Payer: BC Managed Care – PPO | Admitting: Obstetrics and Gynecology

## 2020-12-24 ENCOUNTER — Encounter: Payer: Self-pay | Admitting: Obstetrics and Gynecology

## 2020-12-24 ENCOUNTER — Other Ambulatory Visit: Payer: Self-pay

## 2020-12-24 VITALS — BP 138/82 | HR 75 | Ht 69.0 in | Wt 283.0 lb

## 2020-12-24 DIAGNOSIS — Z3169 Encounter for other general counseling and advice on procreation: Secondary | ICD-10-CM | POA: Diagnosis not present

## 2020-12-24 DIAGNOSIS — Z124 Encounter for screening for malignant neoplasm of cervix: Secondary | ICD-10-CM | POA: Insufficient documentation

## 2020-12-24 DIAGNOSIS — Z01419 Encounter for gynecological examination (general) (routine) without abnormal findings: Secondary | ICD-10-CM

## 2020-12-24 DIAGNOSIS — Z3009 Encounter for other general counseling and advice on contraception: Secondary | ICD-10-CM

## 2020-12-24 MED ORDER — NORGESTIMATE-ETH ESTRADIOL 0.25-35 MG-MCG PO TABS: 1.0000 | ORAL_TABLET | Freq: Every day | ORAL | 1 refills | Status: DC

## 2020-12-24 NOTE — Progress Notes (Signed)
GYNECOLOGY ANNUAL PHYSICAL EXAM PROGRESS NOTE  Subjective:    Autumn Stanley is a 25 y.o. G0P0000 female who presents for an annual exam.  The patient is sexually active.  The patient wears seatbelts: yes. The patient participates in regular exercise: no. Has the patient ever been transfused or tattooed?: yes. The patient reports that there is not domestic violence in her life.    The patient has the following complaints today:  1. She reports that she has been off her birth control since October.  Cycles are regular, has dysmenorrhea for ~ 1 day. Inquires about future conception plans. Is engaged, plans to get married in March of next year.    Menstrual History: Patient's last menstrual period was 11/28/2020. Menarche age: 66 Period Cycle (Days): 28 Period Duration (Days): 3-4 Period Pattern: Regular Menstrual Flow: Light Dysmenorrhea: (!) Moderate Dysmenorrhea Symptoms: Cramping  Gynecologic History: Contraception: None.  History of STI's: Denies Last Pap: 04/21/2017.  Denies history of abnormal pap smears.      Upstream - 12/24/20 0816      Pregnancy Intention Screening   Does the patient want to become pregnant in the next year? Yes    Does the patient's partner want to become pregnant in the next year? Unsure    Would the patient like to discuss contraceptive options today? Yes      Contraception Wrap Up   Current Method No Method - Other Reason          The pregnancy intention screening data noted above was reviewed. Potential methods of contraception were discussed. The patient elected to proceed with Oral Contraceptive.    OB History  Gravida Para Term Preterm AB Living  0 0 0 0 0 0  SAB IAB Ectopic Multiple Live Births  0 0 0 0 0    Past Medical History:  Diagnosis Date  . Frequent headaches   . Kidney stone   . Migraine     Past Surgical History:  Procedure Laterality Date  . MOUTH SURGERY  2016         Family History   Problem Relation Age of Onset  . Arthritis Mother   . Breast cancer Mother 54's  . Arthritis Maternal Grandmother   . Heart disease Maternal Grandmother   . Hyperlipidemia Maternal Grandmother   . Breast cancer Maternal Grandmother 70's  . Prostate cancer Maternal Grandfather   . Hyperlipidemia Maternal Grandfather   . Hyperlipidemia Paternal Grandmother   . Hyperlipidemia Paternal Grandfather   . Hypertension Father     Social History   Socioeconomic History  . Marital status: Single    Spouse name: Not on file  . Number of children: Not on file  . Years of education: Not on file  . Highest education level: Not on file  Occupational History  . Occupation: Administrator, sports  Tobacco Use  . Smoking status: Never Smoker  . Smokeless tobacco: Never Used  Vaping Use  . Vaping Use: Never used  Substance and Sexual Activity  . Alcohol use: Yes    Comment: Socially   . Drug use: No  . Sexual activity: Yes    Birth control/protection: None  Other Topics Concern  . Not on file  Social History Narrative  . Not on file   Social Determinants of Health   Financial Resource Strain: Not on file  Food Insecurity: Not on file  Transportation Needs: Not on file  Physical Activity: Not on file  Stress: Not on  file  Social Connections: Not on file  Intimate Partner Violence: Not on file    Outpatient Encounter Medications as of 12/24/2020  Medication Sig  . acetaminophen (TYLENOL) 500 MG tablet Take 500 mg by mouth every 6 (six) hours as needed.  . [DISCONTINUED] phentermine 37.5 MG capsule Take 1 capsule (37.5 mg total) by mouth every morning. (Patient not taking: No sig reported)  . [DISCONTINUED] SPRINTEC 28 0.25-35 MG-MCG tablet Take 1 tablet by mouth daily. (Patient not taking: Reported on 12/24/2020)  . [DISCONTINUED] topiramate (TOPAMAX) 50 MG tablet Take 1 tablet (50 mg total) by mouth at bedtime. For headache prevention. (Patient not taking: Reported on  12/24/2020)   No facility-administered encounter medications on file as of 12/24/2020.     No Known Allergies   Review of Systems Constitutional: negative for chills, fatigue, fevers and sweats.   Eyes: negative for irritation, redness and visual disturbance Ears, nose, mouth, throat, and face: negative for hearing loss, nasal congestion, snoring and tinnitus Respiratory: negative for asthma, cough, sputum Cardiovascular: negative for chest pain, dyspnea, exertional chest pressure/discomfort, irregular heart beat, palpitations and syncope Gastrointestinal: negative for abdominal pain, change in bowel habits, nausea and vomiting Genitourinary: negative for abnormal menstrual periods, genital lesions, sexual problems and vaginal discharge, dysuria and urinary incontinence. Positive for dysmenorrhea.  Integument/breast: negative for breast lump, breast tenderness and nipple discharge Hematologic/lymphatic: negative for bleeding and easy bruising Musculoskeletal:negative for back pain and muscle weakness Neurological: negative for dizziness, headaches, vertigo and weakness Endocrine: negative for diabetic symptoms including polydipsia, polyuria and skin dryness Allergic/Immunologic: negative for hay fever and urticaria        Objective:  Blood pressure 125/82, pulse (!) 112, weight 263 lb 12.8 oz (119.7 kg). Body mass index is 41.79 kg/m.  General Appearance:    Alert, cooperative, no distress, appears stated age, morbid obesity  Head:    Normocephalic, without obvious abnormality, atraumatic  Eyes:    PERRL, conjunctiva/corneas clear, EOM's intact, both eyes  Ears:    Normal external ear canals, both ears  Nose:   Nares normal, septum midline, mucosa normal, no drainage or sinus tenderness  Throat:   Lips, mucosa, and tongue normal; teeth and gums normal  Neck:   Supple, symmetrical, trachea midline, no adenopathy; thyroid: no enlargement/tenderness/nodules; no carotid bruit or  JVD  Back:     Symmetric, no curvature, ROM normal, no CVA tenderness  Lungs:     Clear to auscultation bilaterally, respirations unlabored  Chest Wall:    No tenderness or deformity   Heart:    Regular rate and rhythm, S1 and S2 normal, no murmur, rub or gallop  Breast Exam:    No tenderness, masses, or nipple abnormality  Abdomen:     Soft, non-tender, bowel sounds active all four quadrants, no masses, no organomegaly.    Genitalia:    Pelvic:external genitalia normal, vagina without lesions, discharge, or tenderness, rectovaginal septum  normal. Cervix normal in appearance, no cervical motion tenderness, no adnexal masses or tenderness.  Uterus normal size, shape, mobile, regular contours, nontender.  Rectal:    Normal external sphincter.  No hemorrhoids appreciated. Internal exam not done.   Extremities:   Extremities normal, atraumatic, no cyanosis or edema  Pulses:   2+ and symmetric all extremities  Skin:   Skin color, texture, turgor normal, no rashes or lesions  Lymph nodes:   Cervical, supraclavicular, and axillary nodes normal  Neurologic:   CNII-XII intact, normal strength, sensation and reflexes throughout  Labs:  Lab Results  Component Value Date   WBC 5.8 08/23/2018   HGB 12.7 08/23/2018   HCT 38.3 08/23/2018   MCV 88 08/23/2018   PLT 235 08/23/2018      Chemistry      Component Value Date/Time   NA 138 08/23/2018 1005   K 3.8 08/23/2018 1005   CL 101 08/23/2018 1005   CO2 20 08/23/2018 1005   BUN 8 08/23/2018 1005   CREATININE 0.79 08/23/2018 1005      Component Value Date/Time   CALCIUM 9.1 08/23/2018 1005   ALKPHOS 112 08/23/2018 1005   AST 51 (H) 08/23/2018 1005   ALT 114 (H) 08/23/2018 1005   BILITOT 0.4 08/23/2018 1005       Assessment:   1. Encounter for well woman exam with routine gynecological exam   2. Pap smear for cervical cancer screening   3. Encounter for other general counseling or advice on contraception   4. Encounter for  preconception consultation   5. Obesity, Class III, BMI 40-49.9 (morbid obesity) (HCC)    Plan:    Blood tests: see orders Breast self exam technique reviewed and patient encouraged to perform self-exam monthly.  Contraception: currently not on any method.  She discussed discontinuing her OCPs as she was concerned about a risk of not being able to become pregnant or delayed pregnancy due to use.  Advised that there is a short return to fertility with use of pills. Patient ok to use pills for an additional 6 months. Discussed healthy lifestyle modifications. Pap smear performed today.   Discussed fertility and future plans for conception. Advised to initiate PNV prior to attempts at conception. Advised on purposeful coitus, tracking fertility with app.  To f/u in 1 year for annual exam.    Hildred Laser, MD [facility]

## 2020-12-24 NOTE — Progress Notes (Signed)
Pt present for annual exam. Pt stopped taking her birth control and would like to discuss other options along with planning a health pregnancy.

## 2020-12-25 LAB — CBC
Hematocrit: 39.8 % (ref 34.0–46.6)
Hemoglobin: 13.7 g/dL (ref 11.1–15.9)
MCH: 30.9 pg (ref 26.6–33.0)
MCHC: 34.4 g/dL (ref 31.5–35.7)
MCV: 90 fL (ref 79–97)
Platelets: 320 10*3/uL (ref 150–450)
RBC: 4.44 x10E6/uL (ref 3.77–5.28)
RDW: 13.2 % (ref 11.7–15.4)
WBC: 7.2 10*3/uL (ref 3.4–10.8)

## 2020-12-25 LAB — LIPID PANEL
Chol/HDL Ratio: 4.2 ratio (ref 0.0–4.4)
Cholesterol, Total: 175 mg/dL (ref 100–199)
HDL: 42 mg/dL (ref >39)
LDL Chol Calc (NIH): 111 mg/dL — ABNORMAL HIGH (ref 0–99)
Triglycerides: 125 mg/dL (ref 0–149)
VLDL Cholesterol Cal: 22 mg/dL (ref 5–40)

## 2020-12-25 LAB — CYTOLOGY - PAP: Diagnosis: NEGATIVE

## 2020-12-25 LAB — COMPREHENSIVE METABOLIC PANEL
ALT: 19 IU/L (ref 0–32)
AST: 13 IU/L (ref 0–40)
Albumin/Globulin Ratio: 1.5 (ref 1.2–2.2)
Albumin: 4.4 g/dL (ref 3.9–5.0)
Alkaline Phosphatase: 92 IU/L (ref 44–121)
BUN/Creatinine Ratio: 11 (ref 9–23)
BUN: 8 mg/dL (ref 6–20)
Bilirubin Total: 0.3 mg/dL (ref 0.0–1.2)
CO2: 21 mmol/L (ref 20–29)
Calcium: 9.6 mg/dL (ref 8.7–10.2)
Chloride: 102 mmol/L (ref 96–106)
Creatinine, Ser: 0.73 mg/dL (ref 0.57–1.00)
Globulin, Total: 2.9 g/dL (ref 1.5–4.5)
Glucose: 92 mg/dL (ref 65–99)
Potassium: 4.5 mmol/L (ref 3.5–5.2)
Sodium: 137 mmol/L (ref 134–144)
Total Protein: 7.3 g/dL (ref 6.0–8.5)
eGFR: 117 mL/min/{1.73_m2} (ref >59)

## 2020-12-25 LAB — TSH: TSH: 0.697 u[IU]/mL (ref 0.450–4.500)

## 2020-12-25 LAB — HEMOGLOBIN A1C
Est. average glucose Bld gHb Est-mCnc: 103 mg/dL
Hgb A1c MFr Bld: 5.2 % (ref 4.8–5.6)

## 2021-08-11 ENCOUNTER — Encounter: Payer: Self-pay | Admitting: Obstetrics and Gynecology

## 2021-09-02 ENCOUNTER — Encounter: Payer: BC Managed Care – PPO | Admitting: Obstetrics and Gynecology

## 2021-12-15 ENCOUNTER — Encounter: Payer: Self-pay | Admitting: Obstetrics and Gynecology

## 2021-12-24 NOTE — Patient Instructions (Addendum)

## 2021-12-24 NOTE — Progress Notes (Signed)
GYNECOLOGY ANNUAL PHYSICAL EXAM PROGRESS NOTE  Subjective:    Autumn Stanley is a 27 y.o. G0P0000 female who presents for an annual exam. The patient is sexually active. The patient participates in regular exercise: yes. Has the patient ever been transfused or tattooed?: yes. The patient reports that there is not domestic violence in her life.   The patient has the following complaints today.  She has been trying to conceive for the past 6 months without success. She would like some advise or assistance with fertility. Recently got married in March. Has h/o irregular cycles (skips 2 or 3 months).  Had workup in 2020 for PCOS which was negative.  Does note that her husband had elevated prolactin levels recently, is waiting to hear back from Endocrinologist.  Also desires to discuss her weight. Believes this may be a factor in her not being able to get pregnant.  Recently changed jobs so is also now more sedentary.  Is trying to exercise now since she is not as active. Also changing dietary habits (increased proteins, eliminated carbs, increased veggies). Has only lost 5 lbs in 2 months. Has tried Phentermine in the past ~ 2-3 years ago, which worked well for her (lost ~ 50 lbs).    Menstrual History: Menarche age: 35 Patient's last menstrual period was 11/10/2021. Period Duration (Days): 3-4 Period Pattern: (!) Irregular Menstrual Flow: Light, Moderate Menstrual Control: Maxi pad, Tampon Menstrual Control Change Freq (Hours): 3-4 Dysmenorrhea: (!) Moderate Dysmenorrhea Symptoms: Cramping, Throbbing, Diarrhea, Headache   Gynecologic History:  Contraception: none History of STI's: Denies Last Pap: 12/24/2020. Results were: normal.  Denies h/o abnormal pap smears.    Upstream - 12/30/21 0810       Pregnancy Intention Screening   Does the patient want to become pregnant in the next year? Yes    Does the patient's partner want to become pregnant in the next year? Yes    Would the  patient like to discuss contraceptive options today? No      Contraception Wrap Up   Current Method No Contraceptive Precautions    End Method No Contraception Precautions            The pregnancy intention screening data noted above was reviewed. Potential methods of contraception were discussed. The patient elected to proceed with No Contraception Precautions.    OB History  Gravida Para Term Preterm AB Living  0 0 0 0 0 0  SAB IAB Ectopic Multiple Live Births  0 0 0 0 0    Past Medical History:  Diagnosis Date   Frequent headaches    Kidney stone    Migraine     Past Surgical History:  Procedure Laterality Date   MOUTH SURGERY  2016    Family History  Problem Relation Age of Onset   Arthritis Mother    Breast cancer Mother    Arthritis Maternal Grandmother    Heart disease Maternal Grandmother    Hyperlipidemia Maternal Grandmother    Breast cancer Maternal Grandmother    Prostate cancer Maternal Grandfather    Hyperlipidemia Maternal Grandfather    Hyperlipidemia Paternal Grandmother    Hyperlipidemia Paternal Grandfather    Hypertension Father     Social History   Socioeconomic History   Marital status: Single    Spouse name: Not on file   Number of children: Not on file   Years of education: Not on file   Highest education level: Not on file  Occupational History  Occupation: Administrator, sports  Tobacco Use   Smoking status: Never   Smokeless tobacco: Never  Vaping Use   Vaping Use: Never used  Substance and Sexual Activity   Alcohol use: Yes    Comment: Socially    Drug use: No   Sexual activity: Yes    Birth control/protection: None  Other Topics Concern   Not on file  Social History Narrative   Not on file   Social Determinants of Health   Financial Resource Strain: Not on file  Food Insecurity: Not on file  Transportation Needs: Not on file  Physical Activity: Not on file  Stress: Not on file  Social Connections: Not on file   Intimate Partner Violence: Not on file    Current Outpatient Medications on File Prior to Visit  Medication Sig Dispense Refill   acetaminophen (TYLENOL) 500 MG tablet Take 500 mg by mouth every 6 (six) hours as needed.     norgestimate-ethinyl estradiol (ORTHO-CYCLEN) 0.25-35 MG-MCG tablet Take 1 tablet by mouth daily. 84 tablet 1   No current facility-administered medications on file prior to visit.    No Known Allergies   Review of Systems Constitutional: negative for chills, fatigue, fevers and sweats Eyes: negative for irritation, redness and visual disturbance Ears, nose, mouth, throat, and face: negative for hearing loss, nasal congestion, snoring and tinnitus Respiratory: negative for asthma, cough, sputum Cardiovascular: negative for chest pain, dyspnea, exertional chest pressure/discomfort, irregular heart beat, palpitations and syncope Gastrointestinal: negative for abdominal pain, change in bowel habits, nausea and vomiting Genitourinary: positive for abnormal menstrual periods (irregular). Negative for genital lesions, sexual problems and vaginal discharge, dysuria and urinary incontinence Integument/breast: negative for breast lump, breast tenderness and nipple discharge Hematologic/lymphatic: negative for bleeding and easy bruising Musculoskeletal:negative for back pain and muscle weakness Neurological: negative for dizziness, headaches, vertigo and weakness Endocrine: negative for diabetic symptoms including polydipsia, polyuria and skin dryness. Positive for facial hair Allergic/Immunologic: negative for hay fever and urticaria      Objective:  Blood pressure (!) 113/55, pulse 81, resp. rate 16, height 5\' 9"  (1.753 m), weight 290 lb (131.5 kg), last menstrual period 11/10/2021.  Body mass index is 42.83 kg/m.  General Appearance:    Alert, cooperative, no distress, appears stated age, morbidly obese  Head:    Normocephalic, without obvious abnormality, atraumatic   Eyes:    PERRL, conjunctiva/corneas clear, EOM's intact, both eyes  Ears:    Normal external ear canals, both ears  Nose:   Nares normal, septum midline, mucosa normal, no drainage or sinus tenderness  Throat:   Lips, mucosa, and tongue normal; teeth and gums normal  Neck:   Supple, symmetrical, trachea midline, no adenopathy; thyroid: no enlargement/tenderness/nodules; no carotid bruit or JVD  Back:     Symmetric, no curvature, ROM normal, no CVA tenderness  Lungs:     Clear to auscultation bilaterally, respirations unlabored  Chest Wall:    No tenderness or deformity   Heart:    Regular rate and rhythm, S1 and S2 normal, no murmur, rub or gallop  Breast Exam:    No tenderness, masses, or nipple abnormality  Abdomen:     Soft, non-tender, bowel sounds active all four quadrants, no masses, no organomegaly.    Genitalia:    Pelvic:external genitalia normal, vagina without lesions, discharge, or tenderness, rectovaginal septum  normal. Cervix normal in appearance, no cervical motion tenderness, no adnexal masses or tenderness.  Uterus normal size, shape, mobile, regular contours, nontender.  Rectal:  Normal external sphincter.  No hemorrhoids appreciated. Internal exam not done.   Extremities:   Extremities normal, atraumatic, no cyanosis or edema  Pulses:   2+ and symmetric all extremities  Skin:   Skin color, texture, turgor normal, no rashes or lesions  Lymph nodes:   Cervical, supraclavicular, and axillary nodes normal  Neurologic:   CNII-XII intact, normal strength, sensation and reflexes throughout   .  Labs:  Lab Results  Component Value Date   WBC 7.2 12/24/2020   HGB 13.7 12/24/2020   HCT 39.8 12/24/2020   MCV 90 12/24/2020   PLT 320 12/24/2020    Lab Results  Component Value Date   CREATININE 0.73 12/24/2020   BUN 8 12/24/2020   NA 137 12/24/2020   K 4.5 12/24/2020   CL 102 12/24/2020   CO2 21 12/24/2020    Lab Results  Component Value Date   ALT 19 12/24/2020    AST 13 12/24/2020   ALKPHOS 92 12/24/2020   BILITOT 0.3 12/24/2020    Lab Results  Component Value Date   TSH 0.697 12/24/2020      Assessment:   1. Encounter for well woman exam with routine gynecological exam   2. Screening for lipid disorders   3. Screening for diabetes mellitus   4. Irregular menses   5. Obesity, Class III, BMI 40-49.9 (morbid obesity) (HCC)   6. Irregular periods      Plan:  - Blood tests: CBC with diff, Comprehensive metabolic panel, Lipoproteins, Prolactin, and Insulin. - Breast self exam technique reviewed and patient encouraged to perform self-exam monthly. - Contraception: none. Desires conception. Discussed regulation of her menses, can use OCPs x 3 months to attempt regulation. Will prescribed Yas.  - Discussed healthy lifestyle modifications.  Patient currently engaging in exercise and dietary modification with minimal results so far. Desires to resume Phentermine to help with weight loss and improve chances for fertility. Discussed risks/benefits. Will prescribe.  - Pap smear  UTD . - Obesity to be managed with lifestyle changes and prescription weight loss meds.  - COVID vaccination status: - Follow up in 1 year for annual exam. RTC in 1 month for weight check.    Hildred LaserAnika Annaleah Arata, MD [facility]

## 2021-12-25 ENCOUNTER — Encounter: Payer: BC Managed Care – PPO | Admitting: Obstetrics and Gynecology

## 2021-12-30 ENCOUNTER — Ambulatory Visit (INDEPENDENT_AMBULATORY_CARE_PROVIDER_SITE_OTHER): Payer: Self-pay | Admitting: Obstetrics and Gynecology

## 2021-12-30 ENCOUNTER — Encounter: Payer: Self-pay | Admitting: Obstetrics and Gynecology

## 2021-12-30 VITALS — BP 113/55 | HR 81 | Resp 16 | Ht 69.0 in | Wt 290.0 lb

## 2021-12-30 DIAGNOSIS — Z13220 Encounter for screening for lipoid disorders: Secondary | ICD-10-CM

## 2021-12-30 DIAGNOSIS — N926 Irregular menstruation, unspecified: Secondary | ICD-10-CM

## 2021-12-30 DIAGNOSIS — Z131 Encounter for screening for diabetes mellitus: Secondary | ICD-10-CM

## 2021-12-30 DIAGNOSIS — Z01419 Encounter for gynecological examination (general) (routine) without abnormal findings: Secondary | ICD-10-CM

## 2021-12-30 MED ORDER — DROSPIRENONE-ETHINYL ESTRADIOL 3-0.03 MG PO TABS: 1.0000 | ORAL_TABLET | Freq: Every day | ORAL | 0 refills | Status: DC

## 2021-12-30 MED ORDER — PHENTERMINE HCL 37.5 MG PO CAPS: 37.5000 mg | ORAL_CAPSULE | ORAL | 0 refills | Status: DC

## 2021-12-31 LAB — CBC
Hematocrit: 40.9 % (ref 34.0–46.6)
Hemoglobin: 13.6 g/dL (ref 11.1–15.9)
MCH: 29.6 pg (ref 26.6–33.0)
MCHC: 33.3 g/dL (ref 31.5–35.7)
MCV: 89 fL (ref 79–97)
Platelets: 315 10*3/uL (ref 150–450)
RBC: 4.6 x10E6/uL (ref 3.77–5.28)
RDW: 13 % (ref 11.7–15.4)
WBC: 5.8 10*3/uL (ref 3.4–10.8)

## 2021-12-31 LAB — COMPREHENSIVE METABOLIC PANEL
ALT: 32 IU/L (ref 0–32)
AST: 21 IU/L (ref 0–40)
Albumin/Globulin Ratio: 1.8 (ref 1.2–2.2)
Albumin: 4.6 g/dL (ref 3.9–5.0)
Alkaline Phosphatase: 96 IU/L (ref 44–121)
BUN/Creatinine Ratio: 17 (ref 9–23)
BUN: 13 mg/dL (ref 6–20)
Bilirubin Total: 0.4 mg/dL (ref 0.0–1.2)
CO2: 22 mmol/L (ref 20–29)
Calcium: 9.6 mg/dL (ref 8.7–10.2)
Chloride: 103 mmol/L (ref 96–106)
Creatinine, Ser: 0.77 mg/dL (ref 0.57–1.00)
Globulin, Total: 2.5 g/dL (ref 1.5–4.5)
Glucose: 88 mg/dL (ref 70–99)
Potassium: 4.6 mmol/L (ref 3.5–5.2)
Sodium: 139 mmol/L (ref 134–144)
Total Protein: 7.1 g/dL (ref 6.0–8.5)
eGFR: 109 mL/min/{1.73_m2} (ref >59)

## 2021-12-31 LAB — LIPID PANEL
Chol/HDL Ratio: 4.8 ratio — ABNORMAL HIGH (ref 0.0–4.4)
Cholesterol, Total: 159 mg/dL (ref 100–199)
HDL: 33 mg/dL — ABNORMAL LOW (ref >39)
LDL Chol Calc (NIH): 103 mg/dL — ABNORMAL HIGH (ref 0–99)
Triglycerides: 130 mg/dL (ref 0–149)
VLDL Cholesterol Cal: 23 mg/dL (ref 5–40)

## 2021-12-31 LAB — PROLACTIN: Prolactin: 28.9 ng/mL — ABNORMAL HIGH (ref 4.8–23.3)

## 2021-12-31 LAB — INSULIN, RANDOM: INSULIN: 42.4 u[IU]/mL — ABNORMAL HIGH (ref 2.6–24.9)

## 2022-01-27 NOTE — Progress Notes (Signed)
GYNECOLOGY PROGRESS NOTE  Subjective:    Patient ID: Autumn Stanley, female    DOB: [birth year], 26 y.o.   MRN: [id number]  HPI  Patient is a 26 y.o. female who presents for 1 month weight management follow up. She has a past history of obesity. She initiated use of Phentermine 1 months ago.   Denies any undesirable side effects and reports compliance with medications. Reports that her menstrual cycle resumed spontaneously this month (has h/o irregular cycles).    Current interventions:  1. Diet - Continues to eat healthier and leaner foods. Often skips breakfast, does a protein shake for lunch, then has 1 meal for lunch (lean meats, veggies), no red meats, also cut out most carbs.  Does note that she does crave sweets but tries not to indulge.  2. Activity - Exercising daily (walking daily 1-2 miles). Plans to introduce weight lifting next month.  3. Reports bowel movements are normal.    The following portions of the patient's history were reviewed and updated as appropriate: allergies, current medications, past family history, past medical history, past social history, past surgical history, and problem list.  Review of Systems Pertinent items are noted in HPI.   Objective:       01/30/2022    8:39 AM 12/30/2021    8:07 AM 12/24/2020    8:11 AM  Vitals with BMI  Height 5' 9"  5' 9"  5' 9"   Weight 277 lbs 10 oz 290 lbs 283 lbs  BMI 40.98 64.68 03.21  Systolic 224 825 003  Diastolic 81 55 82  Pulse 86 81 75    General appearance: alert, cooperative, and no distress Abdomen: soft, non-tender.  Waist circumference 50.5 in.    Labs:  Office Visit on 12/30/2021  Component Date Value Ref Range Status   WBC 12/30/2021 5.8  3.4 - 10.8 x10E3/uL Final   RBC 12/30/2021 4.60  3.77 - 5.28 x10E6/uL Final   Hemoglobin 12/30/2021 13.6  11.1 - 15.9 g/dL Final   Hematocrit 12/30/2021 40.9  34.0 - 46.6 % Final   MCV 12/30/2021 89  79 - 97 fL Final   MCH 12/30/2021 29.6  26.6 - 33.0 pg  Final   MCHC 12/30/2021 33.3  31.5 - 35.7 g/dL Final   RDW 12/30/2021 13.0  11.7 - 15.4 % Final   Platelets 12/30/2021 315  150 - 450 x10E3/uL Final   Glucose 12/30/2021 88  70 - 99 mg/dL Final   BUN 12/30/2021 13  6 - 20 mg/dL Final   Creatinine, Ser 12/30/2021 0.77  0.57 - 1.00 mg/dL Final   eGFR 12/30/2021 109  >59 mL/min/1.73 Final   BUN/Creatinine Ratio 12/30/2021 17  9 - 23 Final   Sodium 12/30/2021 139  134 - 144 mmol/L Final   Potassium 12/30/2021 4.6  3.5 - 5.2 mmol/L Final   Chloride 12/30/2021 103  96 - 106 mmol/L Final   CO2 12/30/2021 22  20 - 29 mmol/L Final   Calcium 12/30/2021 9.6  8.7 - 10.2 mg/dL Final   Total Protein 12/30/2021 7.1  6.0 - 8.5 g/dL Final   Albumin 12/30/2021 4.6  3.9 - 5.0 g/dL Final   Globulin, Total 12/30/2021 2.5  1.5 - 4.5 g/dL Final   Albumin/Globulin Ratio 12/30/2021 1.8  1.2 - 2.2 Final   Bilirubin Total 12/30/2021 0.4  0.0 - 1.2 mg/dL Final   Alkaline Phosphatase 12/30/2021 96  44 - 121 IU/L Final   AST 12/30/2021 21  0 - 40 IU/L Final  ALT 12/30/2021 32  0 - 32 IU/L Final   Cholesterol, Total 12/30/2021 159  100 - 199 mg/dL Final   Triglycerides 12/30/2021 130  0 - 149 mg/dL Final   HDL 12/30/2021 33 (L)  >39 mg/dL Final   VLDL Cholesterol Cal 12/30/2021 23  5 - 40 mg/dL Final   LDL Chol Calc (NIH) 12/30/2021 103 (H)  0 - 99 mg/dL Final   Chol/HDL Ratio 12/30/2021 4.8 (H)  0.0 - 4.4 ratio Final   Comment:                                   T. Chol/HDL Ratio                                             Men  Women                               1/2 Avg.Risk  3.4    3.3                                   Avg.Risk  5.0    4.4                                2X Avg.Risk  9.6    7.1                                3X Avg.Risk 23.4   11.0    INSULIN 12/30/2021 42.4 (H)  2.6 - 24.9 uIU/mL Final   Prolactin 12/30/2021 28.9 (H)  4.8 - 23.3 ng/mL Final    Assessment:   Weight management Obesity, Body mass index is 40.99 kg/m. Elevated  prolactin Mild dyslipidemia  Plan:   Weight management  - doing well with weight loss, can continue current management.  Discussed addition of Topamax to help with evening cravings.  Will start at 25 mg daily (to take a lunchtime). Encouraged to incorporate breakfast into routine (can be small, lean meal such as boiled eggs, oatmeal, etc).  Had mildly elevated prolactin last visit, to repeat at next visit.  Had mild dyslipidemia at last visit, will f/u at end of weight loss regimein (3-6 months).  RTC in 1 month for follow up.     Rubie Maid, MD [facility]

## 2022-01-30 ENCOUNTER — Encounter: Payer: Self-pay | Admitting: Obstetrics and Gynecology

## 2022-01-30 ENCOUNTER — Ambulatory Visit: Payer: BC Managed Care – PPO | Admitting: Obstetrics and Gynecology

## 2022-01-30 VITALS — BP 110/81 | HR 86 | Resp 16 | Ht 69.0 in | Wt 277.6 lb

## 2022-01-30 DIAGNOSIS — R7989 Other specified abnormal findings of blood chemistry: Secondary | ICD-10-CM | POA: Diagnosis not present

## 2022-01-30 DIAGNOSIS — E66813 Obesity, class 3: Secondary | ICD-10-CM | POA: Insufficient documentation

## 2022-01-30 DIAGNOSIS — N926 Irregular menstruation, unspecified: Secondary | ICD-10-CM

## 2022-01-30 DIAGNOSIS — E785 Hyperlipidemia, unspecified: Secondary | ICD-10-CM | POA: Insufficient documentation

## 2022-01-30 DIAGNOSIS — Z7689 Persons encountering health services in other specified circumstances: Secondary | ICD-10-CM | POA: Diagnosis not present

## 2022-01-30 MED ORDER — TOPIRAMATE 25 MG PO TABS: 25.0000 mg | ORAL_TABLET | Freq: Every day | ORAL | 1 refills | Status: DC

## 2022-01-30 MED ORDER — PHENTERMINE HCL 37.5 MG PO CAPS: 37.5000 mg | ORAL_CAPSULE | ORAL | 1 refills | Status: DC

## 2022-01-30 NOTE — Patient Instructions (Signed)
Phentermine Capsules or Tablets What is this medication? PHENTERMINE (FEN ter meen) promotes weight loss. It works by decreasing appetite. It is often used for a short period of time. Changes to diet and exercise are often combined with this medication. This medicine may be used for other purposes; ask your health care provider or pharmacist if you have questions. COMMON BRAND NAME(S): Adipex-P, Atti-Plex P, Atti-Plex P Spansule, Fastin, Lomaira, Pro-Fast, Pro-Fast HS, Pro-Fast SA, Tara-8 What should I tell my care team before I take this medication? They need to know if you have any of these conditions: Agitation or nervousness Diabetes Glaucoma Heart disease High blood pressure History of drug abuse or addiction History of stroke Kidney disease Lung disease called Primary Pulmonary Hypertension (PPH) Taken an MAOI like Carbex, Eldepryl, Marplan, Nardil, or Parnate in last 14 days Taking stimulant medications for attention disorders, weight loss, or to stay awake Thyroid disease An unusual or allergic reaction to phentermine, other medications, foods, dyes, or preservatives Pregnant or trying to get pregnant Breast-feeding How should I use this medication? Take this medication by mouth with a glass of water. Follow the directions on the prescription label. Take your medication at regular intervals. Do not take it more often than directed. Do not stop taking except on your care team's advice. Talk to your care team about the use of this medication in children. While this medication may be prescribed for children 17 years or older for selected conditions, precautions do apply. Overdosage: If you think you have taken too much of this medicine contact a poison control center or emergency room at once. NOTE: This medicine is only for you. Do not share this medicine with others. What if I miss a dose? If you miss a dose, take it as soon as you can. If it is almost time for your next dose, take  only that dose. Do not take double or extra doses. What may interact with this medication? Do not take this medication with any of the following: MAOIs like Carbex, Eldepryl, Marplan, Nardil, and Parnate This medication may also interact with the following: Alcohol Certain medications for depression, anxiety, or psychotic disorders Certain medications for high blood pressure Linezolid Medications for colds or breathing difficulties like pseudoephedrine or phenylephrine Medications for diabetes Sibutramine Stimulant medications for attention disorders, weight loss, or to stay awake This list may not describe all possible interactions. Give your health care provider a list of all the medicines, herbs, non-prescription drugs, or dietary supplements you use. Also tell them if you smoke, drink alcohol, or use illegal drugs. Some items may interact with your medicine. What should I watch for while using this medication? Visit your care team for regular checks on your progress. Do not stop taking except on your care team's advice. You may develop a severe reaction. Your care team will tell you how much medication to take. Do not take this medication close to bedtime. It may prevent you from sleeping. You may get drowsy or dizzy. Do not drive, use machinery, or do anything that needs mental alertness until you know how this medication affects you. Do not stand or sit up quickly, especially if you are an older patient. This reduces the risk of dizzy or fainting spells. Alcohol may increase dizziness and drowsiness. Avoid alcoholic drinks. This medication may affect blood sugar levels. Ask your care team if changes in diet or medications are needed if you have diabetes. Women should inform their care team if they wish to become   pregnant or think they might be pregnant. Losing weight while pregnant is not advised and may cause harm to the unborn child. Talk to your care team for more information. What side  effects may I notice from receiving this medication? Side effects that you should report to your care team as soon as possible: Allergic reactions--skin rash, itching, hives, swelling of the face, lips, tongue, or throat Heart failure--shortness of breath, swelling of the ankles, feet, or hands, sudden weight gain, unusual weakness or fatigue Pulmonary hypertension--shortness of breath, chest pain, fast or irregular heartbeat, feeling faint or lightheaded, fatigue, swelling of the ankles or feet Side effects that usually do not require medical attention (report to your care team if they continue or are bothersome): Change in taste Diarrhea Dizziness Dry mouth Restlessness Trouble sleeping This list may not describe all possible side effects. Call your doctor for medical advice about side effects. You may report side effects to FDA at 1-800-FDA-1088. Where should I keep my medication? Keep out of the reach of children. This medication can be abused. Keep your medication in a safe place to protect it from theft. Do not share this medication with anyone. Selling or giving away this medication is dangerous and against the law. This medication may cause harm and death if it is taken by other adults, children, or pets. Return medication that has not been used to an official disposal site. Contact the DEA at 3645707572 or your city/county government to find a site. If you cannot return the medication, mix any unused medication with a substance like cat litter or coffee grounds. Then throw the medication away in a sealed container like a sealed bag or coffee can with a lid. Do not use the medication after the expiration date. Store at room temperature between 20 and 25 degrees C (68 and 77 degrees F). Keep container tightly closed. NOTE: This sheet is a summary. It may not cover all possible information. If you have questions about this medicine, talk to your doctor, pharmacist, or health care  provider.  2023 Elsevier/Gold Standard (2021-06-20 00:00:00) Preventing Consequences of Unhealthy Weight Loss Behaviors, Adult Reaching and maintaining a healthy weight is important for your overall health. A healthy weight will vary from person to person. It is natural to want to lose weight quickly, using whatever methods seem fastest. However, losing weight in a healthy way is not a quick process. Instead, aim for slow, steady weight loss by making small changes and setting achievable goals. How can unhealthy weight loss behaviors affect me? Using unhealthy behaviors to try to lose weight can cause: Tiredness (fatigue), low heart rate, and low blood pressure. Imbalances in your body. These may be imbalances in: Electrolytes. These are salts and minerals in your blood. Chemicals. These are needed so your body can work properly. Body fluids. Loss of fluids may lead to dehydration. Organ damage or organ failure, especially affecting the kidneys. Thin bones that break easily. Loneliness or relationship problems with your friends and family. Emotional problems, including depression and anxiety. Changing unhealthy weight loss behaviors through lifestyle changes will improve your overall health. Maintaining a healthy weight also lowers your risk of certain conditions, such as: Obesity. Heart disease, high cholesterol, and high blood pressure. Type 2 diabetes. Breathing and sleeping disorders. Stroke. Osteoarthritis. This affects your joints. Osteoporosis. This affects your bones. Some cancers. What can increase my risk? Certain views or feelings about yourself and certain habits can increase your risk of unhealthy weight loss behaviors. These include:  Having depression and being overweight as an adult. Attempting weight loss as a child or teen. Using alcohol, drugs, or tobacco products. What actions can I take to prevent these behaviors? You can make certain lifestyle changes to help you  lose weight in a healthy way. These include eating nutritious foods and exercising regularly. Nutrition  Eat a variety of healthy foods, including fruits and vegetables, whole grains, lean proteins, and low-fat dairy products. Drink water instead of sugary drinks such as juice, soda, or sports drinks. Drink enough fluid to keep your urine pale yellow. Plan healthy, balanced meals. Work with a Data processing manager to make a healthy meal plan that works for you. Limit the following: Foods that are high in fat, salt (sodium), or sugar. These include candy, donuts, pizza, and fast foods. Fried or heavily processed foods. Lifestyle Avoid these unhealthy eating habits: Following a diet that restricts entire types of food. This may be a popular diet that promises extreme results in a short time. Skipping meals to save calories. Not eating anything for long periods of time (fasting). Restricting your calories to far fewer than the number that you need to lose or maintain a healthy weight. Taking laxative pills to make you have more frequent bowel movements. Taking medicines to make your body lose excess fluids (diuretics). Eating an excessive amount of food and then making yourself vomit. This is known as bingeing and purging. Do not use any products that contain nicotine or tobacco. These products include cigarettes, chewing tobacco, and vaping devices, such as e-cigarettes. If you need help quitting, ask your health care provider. Alcohol use Do not drink alcohol if: Your health care provider tells you not to drink. You are pregnant, may be pregnant, or are planning to become pregnant. If you drink alcohol: Limit how much you have to: 0-1 drink a day for women. 0-2 drinks a day for men. Know how much alcohol is in your drink. In the [place] one drink equals one 12 oz bottle of beer (355 mL), one 5 oz glass of wine (148 mL), or one 1 oz glass of hard liquor (44 mL). Activity  Avoid compulsively getting  an extreme amount of exercise. Work with a Data processing manager to make a healthy exercise program. Include different types of exercise in your exercise program, such as strengthening, aerobic, and flexibility exercises. To maintain your weight, get at least 150 minutes of moderate-intensity exercise each week. Moderate-intensity exercise could be brisk walking or biking. To lose a healthy amount of weight, get 60 minutes of moderate-intensity exercise each day. Find ways to reduce stress, such as regular exercise or meditation. Find a hobby or other activity that you enjoy to distract you from eating when you feel stressed or bored. Where to find support For more support, talk with: Your health care provider or dietitian. Ask about support groups. A mental health care provider. Family and friends. Where to find more information Learn more about how to prevent complications from unhealthy weight loss behaviors from: Centers for Disease Control and Prevention: FootballExhibition.com.br [employer] of Mental Health: http://www.maynard.net/ National Eating Disorders Association: www.nationaleatingdisorders.org Contact a health care provider if: You often feel very tired. You notice changes in your skin or your hair. You faint because of dehydration or too much exercise. You struggle to change your unhealthy weight loss behaviors on your own. Unhealthy weight loss behaviors are affecting your daily life or your relationships. You have signs or symptoms of an eating disorder. You have major weight changes in  a short period of time. You feel guilty or ashamed about eating or exercising. Summary Using unhealthy eating behaviors to try to lose weight can cause a variety of physical and emotional problems that affect your overall health and well-being. Aim for slow, steady weight loss by choosing healthy foods, avoiding unhealthy eating habits, and exercising regularly. Contact your health care provider if you struggle  to change your behaviors on your own or if you think that you may have an eating disorder. This information is not intended to replace advice given to you by your health care provider. Make sure you discuss any questions you have with your health care provider. Document Revised: 03/04/2021 Document Reviewed: 03/04/2021 Elsevier Patient Education  2023 [employer].

## 2022-02-02 ENCOUNTER — Encounter: Payer: Self-pay | Admitting: Obstetrics and Gynecology

## 2022-02-04 ENCOUNTER — Telehealth: Payer: Self-pay | Admitting: Primary Care

## 2022-02-04 NOTE — Telephone Encounter (Signed)
Pt called back because she cannot get off work today and does not leave work until 7 or shortly after 7PM. Pt said had similar issue but was lower back pain going down lt leg about 2 yrs ago. Starting on 02/03/22 started having rt lower back pain with pain radiating down rt leg. Pt had pain earlier this morning and pt felt tingly all over her body and like she was going to pass out due to pain but pt laid down and the tingling and feeling like going to pass out passed (pt did not pass out) pt said this morning the rt leg pain was like an electrical shock at pain level between 6 - 8. Pt is not having any pain now. Pt said no feeling or warning prior to shooting pain but usually happens when sitting or bending. Pt also having muscle spasms in rt leg. Pt wants appt for 02/05/22 in afternoon time. Pt scheduled appt with Allayne Gitelman NP on 02/05/22 at 3pm; UC & ED precautions given and pt voiced understanding. Sending note to Allayne Gitelman NP.

## 2022-02-04 NOTE — Telephone Encounter (Signed)
Pt called stating she has severe pain from her lower back going down her leg. I transferred her to Access Nurse so she can speak to someone about this.   Callback Number: [phone]

## 2022-02-04 NOTE — Telephone Encounter (Signed)
[facility] - Client TELEPHONE ADVICE RECORD AccessNurse Patient Name: Autumn Stanley [facility] Gender: Female DOB: [birth year] Age: 26 Y 25 D Return Phone Number: [phone] (Primary) Address: City/ State/ Zip: [place]  [address] Client [facility] - Client Client Site [facility] - Day Provider Vernona Rieger - NP Contact Type Call Who Is Calling Patient / Member / Family / Caregiver Call Type Triage / Clinical Caller Name Olivia Relationship To Patient Other Return Phone Number [phone] (Primary) Chief Complaint FAINTING or PASSING OUT Reason for Call Symptomatic / Request for Health Information Initial Comment Caller states they are calling from the answering service. They have a patient on the line who has severe pain from her lower back pain that is going down to her legs. They are having constant muscle spasms going in her legs and calves. The pain causes her to get light headed, having tingling all over her body from the pain in her right leg. Fells like she was about to pass out. She would like to be seen in the office as soon as possible. Translation No Nurse Assessment Nurse: Daphine Deutscher, RN, Melanie Date/Time (Eastern Time): 02/04/2022 2:01:30 PM Confirm and document reason for call. If symptomatic, describe symptoms. ---Caller states she has severe back pain that got real bad yesterday that goes all the way down to her foot. Has sharp pain down her leg from the back or the very top of her butt. Does the patient have any new or worsening symptoms? ---Yes Will a triage be completed? ---Yes Related visit to physician within the last 2 weeks? ---Yes Does the PT have any chronic conditions? (i.e. diabetes, asthma, this includes High risk factors for pregnancy, etc.) ---No Is the patient pregnant or possibly pregnant? (Ask all females between the ages of 27-55) ---No Is this a  behavioral health or substance abuse call? ---No Guidelines Guideline Title Affirmed Question Affirmed Notes Nurse Date/Time Lamount Cohen Time) Leg Pain [1] Thigh or calf pain AND [2] only 1 side Maralyn Sago 02/04/2022 2:03:25 PM PLEASE NOTE: All timestamps contained within this report are represented as [place] Standard Time. CONFIDENTIALTY NOTICE: This fax transmission is intended only for the addressee. It contains information that is legally privileged, confidential or otherwise protected from use or disclosure. If you are not the intended recipient, you are strictly prohibited from reviewing, disclosing, copying using or disseminating any of this information or taking any action in reliance on or regarding this information. If you have received this fax in error, please notify us immediately by telephone so that we can arrange for its return to [place]. Phone: [phone], Toll-Free: [phone], Fax: [phone] Page: 2 of 2 Call Id: [id number] Guidelines Guideline Title Affirmed Question Affirmed Notes Nurse Date/Time Lamount Cohen Time) AND [3] present > 1 hour (Exception: Chronic unchanged pain.) Disp. Time Lamount Cohen Time) Disposition Final User 02/04/2022 1:59:42 PM Send to Urgent Arn Medal 02/04/2022 2:07:55 PM See HCP within 4 Hours (or PCP triage) Yes Daphine Deutscher, RN, Melanie Final Disposition 02/04/2022 2:07:55 PM See HCP within 4 Hours (or PCP triage) Yes Daphine Deutscher, RN, Mittie Bodo Disagree/Comply Comply Caller Understands Yes PreDisposition Search internet for information Care Advice Given Per Guideline SEE HCP (OR PCP TRIAGE) WITHIN 4 HOURS: * IF OFFICE WILL BE OPEN: You need to be seen within the next 3 or 4 hours. Call your doctor (or NP/PA) now or as soon as the office opens. CALL EMS IF: * You develop any chest  pain or shortness of breath. CALL BACK IF: * You become worse CARE ADVICE given per Leg Pain (Adult) guideline. Referrals GO TO FACILITY UNDECIDE

## 2022-02-04 NOTE — Telephone Encounter (Signed)
Unable to reach pt by phone and pts mailbox is full.will send note to Allayne Gitelman NP,Joellen CMA and lsc triage.

## 2022-02-04 NOTE — Telephone Encounter (Signed)
Noted.  Will evaluate as scheduled. 

## 2022-02-05 ENCOUNTER — Ambulatory Visit (INDEPENDENT_AMBULATORY_CARE_PROVIDER_SITE_OTHER): Payer: BC Managed Care – PPO | Admitting: Primary Care

## 2022-02-05 ENCOUNTER — Encounter: Payer: Self-pay | Admitting: Primary Care

## 2022-02-05 VITALS — BP 120/78 | HR 114 | Temp 98.6°F | Ht 69.0 in | Wt 275.0 lb

## 2022-02-05 DIAGNOSIS — M5441 Lumbago with sciatica, right side: Secondary | ICD-10-CM | POA: Insufficient documentation

## 2022-02-05 MED ORDER — CYCLOBENZAPRINE HCL 5 MG PO TABS: 5.0000 mg | ORAL_TABLET | Freq: Three times a day (TID) | ORAL | 0 refills | Status: DC | PRN

## 2022-02-05 MED ORDER — PREDNISONE 20 MG PO TABS: ORAL_TABLET | ORAL | 0 refills | Status: DC

## 2022-02-05 MED ORDER — KETOROLAC TROMETHAMINE 60 MG/2ML IM SOLN
60.0000 mg | Freq: Once | INTRAMUSCULAR | Status: AC
  Administered 2022-02-05: 60 mg via INTRAMUSCULAR

## 2022-02-05 NOTE — Patient Instructions (Signed)
Start prednisone tablets. Take two tablets my mouth once daily for four days, then one tablet once daily for four days.   Start cyclobenzaprine 5 mg muscle relaxer. You can take 1 tablet by mouth three times daily as needed. This may cause drowsiness.   Walk and stretch when possible.   It was a pleasure to see you today!

## 2022-02-05 NOTE — Assessment & Plan Note (Signed)
Lumbar strain with obvious nerve involvement and muscle spasms.  IM Toradol 60 mg provided today. Start prednisone 40 mg daily x4 days, then 20 mg day x4 days. Start cyclobenzaprine 5 mg 3 times daily as needed.  Drowsiness precautions provided.  We discussed to stretch and walk is much as possible. Consider physical therapy/imaging if no improvement.

## 2022-02-05 NOTE — Progress Notes (Signed)
Subjective:    Patient ID: Autumn Stanley, female    DOB: 09-03-1995, 26 y.o.   MRN: [id number]  HPI  Autumn Stanley is a very pleasant 26 y.o. female with a history of morbid obesity, frequent headaches, dyslipidemia who presents today to discuss back pain.  Her pain is located to the right lower back which began last week. Her pain initially felt like a pulled muscle and stiffness. Two days ago her pain increased significantly with muscle spasms down her right lower extremity, and radiation of pain to her right lower extremity through her toes.  Her pain is worse with extension and flexion of her back, sitting for prolonged periods of time.  Yesterday was the worst pain possible, felt better this morning when waking. She has been exercising several times weekly and walking since May 2023. She denies injury/trauma, heavy lifting, or any other cause for symptoms. She also denies loss of bowel/bladder control, numbness.  She's been taking Tylenol without improvement.  Walking does help with her pain.    Review of Systems  Musculoskeletal:  Positive for back pain and myalgias.  Skin:  Negative for color change.  Neurological:  Negative for weakness and numbness.         Past Medical History:  Diagnosis Date   Frequent headaches    Kidney stone    Migraine     Social History   Socioeconomic History   Marital status: Single    Spouse name: Not on file   Number of children: Not on file   Years of education: Not on file   Highest education level: Not on file  Occupational History   Occupation: Administrator, sports  Tobacco Use   Smoking status: Never   Smokeless tobacco: Never  Vaping Use   Vaping Use: Never used  Substance and Sexual Activity   Alcohol use: Yes    Comment: Socially    Drug use: No   Sexual activity: Yes    Birth control/protection: None  Other Topics Concern   Not on file  Social History Narrative   Not on file   Social Determinants of  Health   Financial Resource Strain: Not on file  Food Insecurity: Not on file  Transportation Needs: Not on file  Physical Activity: Not on file  Stress: Not on file  Social Connections: Not on file  Intimate Partner Violence: Not on file    Past Surgical History:  Procedure Laterality Date   MOUTH SURGERY  2016    Family History  Problem Relation Age of Onset   Arthritis Mother    Breast cancer Mother    Arthritis Maternal Grandmother    Heart disease Maternal Grandmother    Hyperlipidemia Maternal Grandmother    Breast cancer Maternal Grandmother    Prostate cancer Maternal Grandfather    Hyperlipidemia Maternal Grandfather    Hyperlipidemia Paternal Grandmother    Hyperlipidemia Paternal Grandfather    Hypertension Father     No Known Allergies  Current Outpatient Medications on File Prior to Visit  Medication Sig Dispense Refill   acetaminophen (TYLENOL) 500 MG tablet Take 500 mg by mouth every 6 (six) hours as needed.     drospirenone-ethinyl estradiol (YASMIN 28) 3-0.03 MG tablet Take 1 tablet by mouth daily. 84 tablet 0   phentermine 37.5 MG capsule Take 1 capsule (37.5 mg total) by mouth every morning. 30 capsule 1   topiramate (TOPAMAX) 25 MG tablet Take 1 tablet (25 mg total) by mouth daily.  Take at lunchtime 90 tablet 1   No current facility-administered medications on file prior to visit.    BP 120/78   Pulse (!) 114   Temp 98.6 F (37 C) (Oral)   Ht 5\' 9"  (1.753 m)   Wt 275 lb (124.7 kg)   SpO2 99%   BMI 40.61 kg/m  Objective:   Physical Exam Constitutional:      Comments: Appears uncomfortable  Musculoskeletal:     Lumbar back: Spasms present. No tenderness or bony tenderness. Decreased range of motion. Negative right straight leg raise test and negative left straight leg raise test.       Back:       Legs:     Comments: Fasciculation of right calf muscle noted during exam  Skin:    General: Skin is warm and dry.            Assessment & Plan:   Problem List Items Addressed This Visit       Nervous and Auditory   Acute right-sided low back pain with right-sided sciatica - Primary    Lumbar strain with obvious nerve involvement and muscle spasms.  IM Toradol 60 mg provided today. Start prednisone 40 mg daily x4 days, then 20 mg day x4 days. Start cyclobenzaprine 5 mg 3 times daily as needed.  Drowsiness precautions provided.  We discussed to stretch and walk is much as possible. Consider physical therapy/imaging if no improvement.      Relevant Medications   cyclobenzaprine (FLEXERIL) 5 MG tablet   predniSONE (DELTASONE) 20 MG tablet       , NP

## 2022-02-18 ENCOUNTER — Encounter: Payer: Self-pay | Admitting: Obstetrics and Gynecology

## 2022-03-03 ENCOUNTER — Encounter: Payer: BC Managed Care – PPO | Admitting: Obstetrics and Gynecology

## 2022-03-25 ENCOUNTER — Other Ambulatory Visit: Payer: Self-pay | Admitting: Obstetrics and Gynecology

## 2022-04-07 ENCOUNTER — Encounter: Payer: BC Managed Care – PPO | Admitting: Obstetrics and Gynecology

## 2022-04-21 ENCOUNTER — Encounter: Payer: BC Managed Care – PPO | Admitting: Obstetrics and Gynecology

## 2022-04-28 ENCOUNTER — Other Ambulatory Visit (HOSPITAL_BASED_OUTPATIENT_CLINIC_OR_DEPARTMENT_OTHER): Payer: Self-pay | Admitting: Family Medicine

## 2022-04-28 ENCOUNTER — Telehealth (HOSPITAL_BASED_OUTPATIENT_CLINIC_OR_DEPARTMENT_OTHER): Payer: Self-pay

## 2022-04-28 DIAGNOSIS — R9389 Abnormal findings on diagnostic imaging of other specified body structures: Secondary | ICD-10-CM

## 2022-04-29 ENCOUNTER — Encounter (HOSPITAL_BASED_OUTPATIENT_CLINIC_OR_DEPARTMENT_OTHER): Payer: Self-pay

## 2022-04-29 ENCOUNTER — Ambulatory Visit (HOSPITAL_BASED_OUTPATIENT_CLINIC_OR_DEPARTMENT_OTHER)
Admission: RE | Admit: 2022-04-29 | Discharge: 2022-04-29 | Disposition: A | Discharge status: Home / Self Care | Payer: BC Managed Care – PPO | Source: Ambulatory Visit | Attending: Family Medicine | Admitting: Family Medicine

## 2022-04-29 DIAGNOSIS — R9389 Abnormal findings on diagnostic imaging of other specified body structures: Secondary | ICD-10-CM

## 2022-05-01 ENCOUNTER — Telehealth (HOSPITAL_BASED_OUTPATIENT_CLINIC_OR_DEPARTMENT_OTHER): Payer: Self-pay

## 2022-05-06 ENCOUNTER — Encounter: Payer: Self-pay | Admitting: Obstetrics and Gynecology

## 2022-05-06 ENCOUNTER — Ambulatory Visit: Payer: BC Managed Care – PPO | Admitting: Family Medicine

## 2022-05-06 ENCOUNTER — Encounter: Payer: Self-pay | Admitting: Family Medicine

## 2022-05-06 ENCOUNTER — Ambulatory Visit (INDEPENDENT_AMBULATORY_CARE_PROVIDER_SITE_OTHER): Payer: BC Managed Care – PPO | Admitting: Family Medicine

## 2022-05-06 VITALS — BP 104/78 | HR 81 | Temp 98.0°F | Ht 69.0 in | Wt 273.0 lb

## 2022-05-06 DIAGNOSIS — R509 Fever, unspecified: Secondary | ICD-10-CM

## 2022-05-06 DIAGNOSIS — U071 COVID-19: Secondary | ICD-10-CM | POA: Insufficient documentation

## 2022-05-06 LAB — POCT RAPID STREP A (OFFICE): Rapid Strep A Screen: NEGATIVE

## 2022-05-06 LAB — POC INFLUENZA A&B (BINAX/QUICKVUE)
Influenza A, POC: NEGATIVE
Influenza B, POC: NEGATIVE

## 2022-05-06 LAB — POC COVID19 BINAXNOW: SARS Coronavirus 2 Ag: POSITIVE — AB

## 2022-05-06 NOTE — Progress Notes (Signed)
Patient ID: Autumn Stanley, female    DOB: 06-07-1996, 26 y.o.   MRN: [id number]  This visit was conducted in person.  BP 104/78 (BP Location: Left Arm, Patient Position: Sitting, Cuff Size: Normal)   Pulse 81   Temp 98 F (36.7 C) (Temporal)   Ht 5' 9"  (1.753 m)   Wt 273 lb (123.8 kg)   SpO2 100%   BMI 40.32 kg/m    CC:  Chief Complaint  Patient presents with   Acute Visit    Patient has had fever, sore throat, stuffy nose and chest congestion since Saturday. She has mainly been using cough drops and chloraseptic spray for her sx.    Subjective:   HPI: Autumn Stanley is a 26 y.o. female patient of Tawni Millers  with obesity presenting on 05/06/2022 for Acute Visit (Patient has had fever, sore throat, stuffy nose and chest congestion since Saturday. She has mainly been using cough drops and chloraseptic spray for her sx.)  Date of onset: September 30  Initial symptoms:Started with severe ST, fever 101  Some body aches. Mild congestion/runny nose. Occ mild cough.  No ear pain, no sinus pressure. Mild SOB, no wheezing No sick contacts.  No chronic lung disease.   COVID 19 screen COVID testing: COVID vaccine: none COVID exposure: No recent travel or known exposure to COVID19  The importance of social distancing was discussed today.    Relevant past medical, surgical, family and social history reviewed and updated as indicated. Interim medical history since our last visit reviewed. Allergies and medications reviewed and updated. Outpatient Medications Prior to Visit  Medication Sig Dispense Refill   acetaminophen (TYLENOL) 500 MG tablet Take 500 mg by mouth every 6 (six) hours as needed.     topiramate (TOPAMAX) 25 MG tablet Take 1 tablet (25 mg total) by mouth daily. Take at lunchtime 90 tablet 1   cyclobenzaprine (FLEXERIL) 5 MG tablet Take 1 tablet (5 mg total) by mouth 3 (three) times daily as needed for muscle spasms. 15 tablet 0   phentermine 37.5  MG capsule Take 1 capsule (37.5 mg total) by mouth every morning. 30 capsule 1   predniSONE (DELTASONE) 20 MG tablet Take two tablets my mouth once daily for four days, then one tablet once daily for four days. 12 tablet 0   SYEDA 3-0.03 MG tablet TAKE 1 TABLET BY MOUTH EVERY DAY 84 tablet 3   No facility-administered medications prior to visit.     Per HPI unless specifically indicated in ROS section below Review of Systems  Constitutional:  Positive for fatigue and fever.  HENT:  Positive for congestion.   Eyes:  Negative for pain.  Respiratory:  Positive for cough. Negative for shortness of breath.   Cardiovascular:  Negative for chest pain, palpitations and leg swelling.  Gastrointestinal:  Negative for abdominal pain.  Genitourinary:  Negative for dysuria and vaginal bleeding.  Musculoskeletal:  Negative for back pain.  Neurological:  Negative for syncope, light-headedness and headaches.  Psychiatric/Behavioral:  Negative for dysphoric mood.    Objective:  BP 104/78 (BP Location: Left Arm, Patient Position: Sitting, Cuff Size: Normal)   Pulse 81   Temp 98 F (36.7 C) (Temporal)   Ht 5' 9"  (1.753 m)   Wt 273 lb (123.8 kg)   SpO2 100%   BMI 40.32 kg/m   Wt Readings from Last 3 Encounters:  05/06/22 273 lb (123.8 kg)  02/05/22 275 lb (124.7 kg)  01/30/22 277 lb  9.6 oz (125.9 kg)      Physical Exam Constitutional:      General: She is not in acute distress.    Appearance: She is well-developed. She is not ill-appearing or toxic-appearing.  HENT:     Head: Normocephalic.     Right Ear: Hearing, tympanic membrane, ear canal and external ear normal. Tympanic membrane is not erythematous, retracted or bulging.     Left Ear: Hearing, tympanic membrane, ear canal and external ear normal. Tympanic membrane is not erythematous, retracted or bulging.     Nose: Mucosal edema and rhinorrhea present.     Right Turbinates: Swollen.     Left Turbinates: Swollen.     Right Sinus: No  maxillary sinus tenderness or frontal sinus tenderness.     Left Sinus: No maxillary sinus tenderness or frontal sinus tenderness.     Mouth/Throat:     Pharynx: Uvula midline. Posterior oropharyngeal erythema present. No pharyngeal swelling, oropharyngeal exudate or uvula swelling.     Tonsils: No tonsillar exudate or tonsillar abscesses.  Eyes:     General: Lids are normal. Lids are everted, no foreign bodies appreciated.     Conjunctiva/sclera: Conjunctivae normal.     Pupils: Pupils are equal, round, and reactive to light.  Neck:     Thyroid: No thyroid mass or thyromegaly.     Vascular: No carotid bruit.     Trachea: Trachea normal.  Cardiovascular:     Rate and Rhythm: Normal rate and regular rhythm.     Pulses: Normal pulses.     Heart sounds: Normal heart sounds, S1 normal and S2 normal. No murmur heard.    No friction rub. No gallop.  Pulmonary:     Effort: Pulmonary effort is normal. No tachypnea or respiratory distress.     Breath sounds: Normal breath sounds. No decreased breath sounds, wheezing, rhonchi or rales.  Musculoskeletal:     Cervical back: Normal range of motion and neck supple.  Skin:    General: Skin is warm and dry.     Findings: No rash.  Neurological:     Mental Status: She is alert.  Psychiatric:        Mood and Affect: Mood is not anxious or depressed.        Speech: Speech normal.        Behavior: Behavior normal. Behavior is cooperative.        Judgment: Judgment normal.       Results for orders placed or performed in visit on 12/30/21  CBC  Result Value Ref Range   WBC 5.8 3.4 - 10.8 x10E3/uL   RBC 4.60 3.77 - 5.28 x10E6/uL   Hemoglobin 13.6 11.1 - 15.9 g/dL   Hematocrit 40.9 34.0 - 46.6 %   MCV 89 79 - 97 fL   MCH 29.6 26.6 - 33.0 pg   MCHC 33.3 31.5 - 35.7 g/dL   RDW 13.0 11.7 - 15.4 %   Platelets 315 150 - 450 x10E3/uL  Comp Met (CMET)  Result Value Ref Range   Glucose 88 70 - 99 mg/dL   BUN 13 6 - 20 mg/dL   Creatinine, Ser 0.77  0.57 - 1.00 mg/dL   eGFR 109 >59 mL/min/1.73   BUN/Creatinine Ratio 17 9 - 23   Sodium 139 134 - 144 mmol/L   Potassium 4.6 3.5 - 5.2 mmol/L   Chloride 103 96 - 106 mmol/L   CO2 22 20 - 29 mmol/L   Calcium 9.6 8.7 - 10.2  mg/dL   Total Protein 7.1 6.0 - 8.5 g/dL   Albumin 4.6 3.9 - 5.0 g/dL   Globulin, Total 2.5 1.5 - 4.5 g/dL   Albumin/Globulin Ratio 1.8 1.2 - 2.2   Bilirubin Total 0.4 0.0 - 1.2 mg/dL   Alkaline Phosphatase 96 44 - 121 IU/L   AST 21 0 - 40 IU/L   ALT 32 0 - 32 IU/L  Lipid panel  Result Value Ref Range   Cholesterol, Total 159 100 - 199 mg/dL   Triglycerides 130 0 - 149 mg/dL   HDL 33 (L) >39 mg/dL   VLDL Cholesterol Cal 23 5 - 40 mg/dL   LDL Chol Calc (NIH) 103 (H) 0 - 99 mg/dL   Chol/HDL Ratio 4.8 (H) 0.0 - 4.4 ratio  Insulin, random  Result Value Ref Range   INSULIN 42.4 (H) 2.6 - 24.9 uIU/mL  Prolactin  Result Value Ref Range   Prolactin 28.9 (H) 4.8 - 23.3 ng/mL     COVID 19 screen:  No recent travel or known exposure to COVID19 The patient denies respiratory symptoms of COVID 19 at this time. The importance of social distancing was discussed today.   Assessment and Plan    Problem List Items Addressed This Visit     COVID-19    Acute, testing for COVID positive in office.  Of note strep and flu testing negative. She is past treatment range for antiviral.  We will move forward with symptomatic and supportive care.  She will increase fluids, rest and use ibuprofen 800 mg p.o. 3 times daily for sore throat and body pain. Return and ER precautions were provided. Reviewed expected time course of COVID, and prevention of spread of COVID. Note for work was given to return wearing mask on day 6 of illness.      Other Visit Diagnoses     Fever, unspecified fever cause    -  Primary   Relevant Orders   POC COVID-19 BinaxNow (Completed)   POC Influenza A&B(BINAX/QUICKVUE) (Completed)   POCT rapid strep A (Completed)      Orders Placed This  Encounter  Procedures   POC COVID-19 BinaxNow    Order Specific Question:   Previously tested for COVID-19    Answer:   No    Order Specific Question:   Resident in a congregate (group) care setting    Answer:   No    Order Specific Question:   Employed in healthcare setting    Answer:   No    Order Specific Question:   Pregnant    Answer:   No   POC Influenza A&B(BINAX/QUICKVUE)   POCT rapid strep A     Eliezer Lofts, MD

## 2022-05-06 NOTE — Assessment & Plan Note (Signed)
Acute, testing for COVID positive in office.  Of note strep and flu testing negative. She is past treatment range for antiviral.  We will move forward with symptomatic and supportive care.  She will increase fluids, rest and use ibuprofen 800 mg p.o. 3 times daily for sore throat and body pain. Return and ER precautions were provided. Reviewed expected time course of COVID, and prevention of spread of COVID. Note for work was given to return wearing mask on day 6 of illness.

## 2022-05-08 ENCOUNTER — Encounter: Payer: Self-pay | Admitting: Primary Care

## 2022-05-18 ENCOUNTER — Encounter: Payer: Self-pay | Admitting: Family Medicine

## 2022-05-19 ENCOUNTER — Other Ambulatory Visit: Payer: Self-pay | Admitting: Family Medicine

## 2022-05-19 MED ORDER — AZITHROMYCIN 250 MG PO TABS: ORAL_TABLET | ORAL | 0 refills | Status: DC

## 2022-05-19 MED ORDER — GUAIFENESIN-CODEINE 100-10 MG/5ML PO SYRP: 5.0000 mL | ORAL_SOLUTION | Freq: Every evening | ORAL | 0 refills | Status: DC | PRN

## 2022-05-19 NOTE — Progress Notes (Signed)
PDMP reviewed during this encounter.  

## 2022-08-13 DIAGNOSIS — L682 Localized hypertrichosis: Secondary | ICD-10-CM | POA: Diagnosis not present

## 2022-08-13 DIAGNOSIS — N926 Irregular menstruation, unspecified: Secondary | ICD-10-CM | POA: Diagnosis not present

## 2022-10-09 DIAGNOSIS — N926 Irregular menstruation, unspecified: Secondary | ICD-10-CM | POA: Diagnosis not present

## 2022-11-05 DIAGNOSIS — Z3202 Encounter for pregnancy test, result negative: Secondary | ICD-10-CM | POA: Diagnosis not present

## 2022-11-05 DIAGNOSIS — N926 Irregular menstruation, unspecified: Secondary | ICD-10-CM | POA: Diagnosis not present

## 2023-03-10 DIAGNOSIS — Z5181 Encounter for therapeutic drug level monitoring: Secondary | ICD-10-CM | POA: Diagnosis not present

## 2023-03-16 ENCOUNTER — Ambulatory Visit: Payer: BC Managed Care – PPO | Admitting: Family Medicine

## 2023-03-16 ENCOUNTER — Encounter: Payer: Self-pay | Admitting: Family Medicine

## 2023-03-16 VITALS — BP 104/70 | HR 87 | Temp 98.0°F | Ht 69.0 in | Wt 249.0 lb

## 2023-03-16 DIAGNOSIS — M5441 Lumbago with sciatica, right side: Secondary | ICD-10-CM | POA: Diagnosis not present

## 2023-03-16 MED ORDER — PREDNISONE 20 MG PO TABS: ORAL_TABLET | ORAL | 0 refills | Status: DC

## 2023-03-16 NOTE — Assessment & Plan Note (Signed)
Acute, recurrent right-sided sciatica.  Will treat with prednisone taper, heat and home physical therapy. No red flags.  No indication for imaging. Given this is the second time she has had an issue with this I will go ahead and refer her to formal physical therapy.  Return and ER precautions provided.

## 2023-03-16 NOTE — Patient Instructions (Signed)
Start prednisone taper for anti-inflammatory effect.  Continue home physical therapy and walking as able.  Can use heat on low back.  I have placed a referral to physical therapy, the referral coordinator should be contacting you shortly to get this arranged.  Call if your symptoms are worsening or not improving as expected over the next 2 to 4 weeks.

## 2023-03-16 NOTE — Progress Notes (Signed)
Patient ID: Autumn Stanley, female    DOB: 09-Jul-1996, 27 y.o.   MRN: [id number]  This visit was conducted in person.  BP 104/70 (BP Location: Left Arm, Patient Position: Sitting, Cuff Size: Large)   Pulse 87   Temp 98 F (36.7 C) (Temporal)   Ht 5\' 9"  (1.753 m)   Wt 249 lb (112.9 kg)   LMP 03/04/2023   SpO2 95%   BMI 36.77 kg/m    CC:  Chief Complaint  Patient presents with   Sciatica    Right    Subjective:   HPI: Autumn Stanley is a 27 y.o. female patient of Mayra Reel, NP presenting on 03/16/2023 for Sciatica (Right)  She has history of acute right-sided low back pain with sciatica.  Reviewed last office visit for this issue from February 05, 2022 At that point she was treated with IM Toradol 60 mg x 1, prednisone taper and cyclobenzaprine as needed.  Symptoms resolved completely.  Today she reports  new onset pain in right lower back.... pain radiating down right leg.  Shooting pain when bending forward.  No numbness  and weakness in right leg.  No fever, no incontinence.   She has not taken any meds yet.. no heat.  She walks every night, doing some home stretching.   Relevant past medical, surgical, family and social history reviewed and updated as indicated. Interim medical history since our last visit reviewed. Allergies and medications reviewed and updated. Outpatient Medications Prior to Visit  Medication Sig Dispense Refill   acetaminophen (TYLENOL) 500 MG tablet Take 500 mg by mouth every 6 (six) hours as needed.     metFORMIN (GLUCOPHAGE) 500 MG tablet Take 500 mg by mouth 3 (three) times daily.     azithromycin (ZITHROMAX) 250 MG tablet 2 tab po x 1 day then 1 tab po daily 6 tablet 0   guaiFENesin-codeine (ROBITUSSIN AC) 100-10 MG/5ML syrup Take 5-10 mLs by mouth at bedtime as needed for cough. 180 mL 0   topiramate (TOPAMAX) 25 MG tablet Take 1 tablet (25 mg total) by mouth daily. Take at lunchtime 90 tablet 1   No facility-administered  medications prior to visit.     Per HPI unless specifically indicated in ROS section below Review of Systems  Constitutional:  Negative for fatigue and fever.  HENT:  Negative for congestion.   Eyes:  Negative for pain.  Respiratory:  Negative for cough and shortness of breath.   Cardiovascular:  Negative for chest pain, palpitations and leg swelling.  Gastrointestinal:  Negative for abdominal pain.  Genitourinary:  Negative for dysuria and vaginal bleeding.  Musculoskeletal:  Positive for back pain.  Neurological:  Negative for syncope, light-headedness and headaches.  Psychiatric/Behavioral:  Negative for dysphoric mood.    Objective:  BP 104/70 (BP Location: Left Arm, Patient Position: Sitting, Cuff Size: Large)   Pulse 87   Temp 98 F (36.7 C) (Temporal)   Ht 5\' 9"  (1.753 m)   Wt 249 lb (112.9 kg)   LMP 03/04/2023   SpO2 95%   BMI 36.77 kg/m   Wt Readings from Last 3 Encounters:  03/16/23 249 lb (112.9 kg)  05/06/22 273 lb (123.8 kg)  02/05/22 275 lb (124.7 kg)      Physical Exam Constitutional:      General: She is not in acute distress.    Appearance: Normal appearance. She is well-developed. She is not ill-appearing or toxic-appearing.  HENT:     Head: Normocephalic.  Right Ear: Hearing, tympanic membrane, ear canal and external ear normal. Tympanic membrane is not erythematous, retracted or bulging.     Left Ear: Hearing, tympanic membrane, ear canal and external ear normal. Tympanic membrane is not erythematous, retracted or bulging.     Nose: No mucosal edema or rhinorrhea.     Right Sinus: No maxillary sinus tenderness or frontal sinus tenderness.     Left Sinus: No maxillary sinus tenderness or frontal sinus tenderness.     Mouth/Throat:     Mouth: Oropharynx is clear and moist and mucous membranes are normal.     Pharynx: Uvula midline.  Eyes:     General: Lids are normal. Lids are everted, no foreign bodies appreciated.     Extraocular Movements: EOM  normal.     Conjunctiva/sclera: Conjunctivae normal.     Pupils: Pupils are equal, round, and reactive to light.  Neck:     Thyroid: No thyroid mass or thyromegaly.     Vascular: No carotid bruit.     Trachea: Trachea normal.  Cardiovascular:     Rate and Rhythm: Normal rate and regular rhythm.     Pulses: Normal pulses.     Heart sounds: Normal heart sounds, S1 normal and S2 normal. No murmur heard.    No friction rub. No gallop.  Pulmonary:     Effort: Pulmonary effort is normal. No tachypnea or respiratory distress.     Breath sounds: Normal breath sounds. No decreased breath sounds, wheezing, rhonchi or rales.  Abdominal:     General: Bowel sounds are normal.     Palpations: Abdomen is soft.     Tenderness: There is no abdominal tenderness.  Musculoskeletal:     Cervical back: Normal range of motion and neck supple.     Lumbar back: Tenderness present. No spasms or bony tenderness. Decreased range of motion. Positive right straight leg raise test. Negative left straight leg raise test.  Skin:    General: Skin is warm, dry and intact.     Findings: No rash.  Neurological:     Mental Status: She is alert.  Psychiatric:        Mood and Affect: Mood is not anxious or depressed.        Speech: Speech normal.        Behavior: Behavior normal. Behavior is cooperative.        Thought Content: Thought content normal.        Cognition and Memory: Cognition and memory normal.        Judgment: Judgment normal.       Results for orders placed or performed in visit on 05/06/22  POC COVID-19 BinaxNow  Result Value Ref Range   SARS Coronavirus 2 Ag Positive (A) Negative  POC Influenza A&B(BINAX/QUICKVUE)  Result Value Ref Range   Influenza A, POC Negative Negative   Influenza B, POC Negative Negative  POCT rapid strep A  Result Value Ref Range   Rapid Strep A Screen Negative Negative    Assessment and Plan  Acute right-sided low back pain with right-sided sciatica Assessment  & Plan: Acute, recurrent right-sided sciatica.  Will treat with prednisone taper, heat and home physical therapy. No red flags.  No indication for imaging. Given this is the second time she has had an issue with this I will go ahead and refer her to formal physical therapy.  Return and ER precautions provided.  Orders: -     Ambulatory referral to Physical Therapy  Other  orders -     predniSONE; 3 tabs by mouth daily x 3 days, then 2 tabs by mouth daily x 2 days then 1 tab by mouth daily x 2 days  Dispense: 15 tablet; Refill: 0    No follow-ups on file.   Kerby Nora, MD

## 2023-03-19 ENCOUNTER — Encounter: Payer: Self-pay | Admitting: Family Medicine

## 2023-04-20 DIAGNOSIS — Z1331 Encounter for screening for depression: Secondary | ICD-10-CM | POA: Diagnosis not present

## 2023-04-20 DIAGNOSIS — Z01419 Encounter for gynecological examination (general) (routine) without abnormal findings: Secondary | ICD-10-CM | POA: Diagnosis not present

## 2023-07-29 ENCOUNTER — Encounter: Payer: Self-pay | Admitting: Family Medicine

## 2023-07-29 ENCOUNTER — Telehealth: Payer: Self-pay | Admitting: Primary Care

## 2023-07-29 ENCOUNTER — Ambulatory Visit: Payer: BC Managed Care – PPO | Admitting: Family Medicine

## 2023-07-29 VITALS — BP 110/70 | HR 82 | Temp 98.4°F | Ht 69.0 in | Wt 275.4 lb

## 2023-07-29 DIAGNOSIS — M5441 Lumbago with sciatica, right side: Secondary | ICD-10-CM

## 2023-07-29 MED ORDER — PREDNISONE 20 MG PO TABS: ORAL_TABLET | ORAL | 0 refills | Status: DC

## 2023-07-29 NOTE — Progress Notes (Signed)
Autumn Guizar T. Rahman Ferrall, MD, CAQ Sports Medicine [facility] at [facility] [address] [place], [address]  Phone: [phone]  FAX: [phone]  Autumn Stanley - 27 y.o. female  MRN [id number]  Date of Birth: Mar 22, 1996  Date: 07/29/2023  PCP: Doreene Nest, NP  Referral: Doreene Nest, NP  Chief Complaint  Patient presents with   Back Pain    Started 2 days ago-Sciatica   Subjective:   Autumn Stanley is a 27 y.o. very pleasant female patient who presents with the following: Back Pain  ongoing for approximately: 2 days The patient has had back pain before, and she has had 2 distinct episodes of some severe back pain with radiculopathy in the last year. The back pain is localized into the lumbar spine area. They also describe R radiculopathy.  Saw Amy 03/2023  - ref PT, but she did not ultimately go  Last year had some back pain and again in July.  R buttocks and down to the back of the knee.  Previously she did have some radiculopathy to the foot, however at this point it is going no farther than the knee No old surgery  Normally walks 3-4 miles a day,  No numbness or tingling Some muscle spasms  Stretching some - took some motrin this morning Christmas eve symptoms started  No numbness or tingling. No bowel or bladder incontinence. No focal weakness. Prior interventions: None Physical therapy: No Chiropractic manipulations: No Acupuncture: No Osteopathic manipulation: No Heat or cold: Minimal effect   Review of Systems is noted in the HPI, as appropriate  Objective:   Blood pressure 110/70, pulse 82, temperature 98.4 F (36.9 C), temperature source Temporal, height 5\' 9"  (1.753 m), weight 275 lb 6 oz (124.9 kg), last menstrual period 07/14/2023, SpO2 99%.  GEN: No acute distress; alert,appropriate. PULM: Breathing comfortably in no respiratory distress PSYCH: Normally interactive.   Range of motion at  the  waist: Flexion, rotation and lateral bending: Forward flexion to 65 degrees.  Intact extension, lateral bending, and rotation maneuvers  No echymosis or edema Rises to examination table with no difficulty Gait: minimally antalgic  Inspection/Deformity: No abnormality Paraspinus T: L4-S1 and pain in the right buttocks  B Ankle Dorsiflexion (L5,4): 5/5 B Great Toe Dorsiflexion (L5,4): 5/5 Heel Walk (L5): WNL Toe Walk (S1): WNL Rise/Squat (L4): WNL, mild pain  SENSORY B Medial Foot (L4): WNL B Dorsum (L5): WNL B Lateral (S1): WNL Light Touch: WNL  REFLEXES Knee (L4): 2+ Ankle (S1): 2+  B SLR, seated: neg B SLR, supine: Back pain B FABER: neg B Reverse FABER: Some induction of pain B Greater Troch: NT B Log Roll: neg B Stork: NT B Sciatic Notch: pain  Radiology: No results found.  Assessment and Plan:     ICD-10-CM   1. Acute right-sided low back pain with right-sided sciatica  M54.41 Ambulatory referral to Physical Therapy     Anatomy reviewed. Conservative algorithms for acute back pain generally begin with the following: NSAIDS, Muscle Relaxants, Mild pain medication Given recurrent nature with fairly significant radiculopathy, ongoing to go ahead and start the patient on steroids.  Last time she had radicular back pain she did have improvement relatively quickly with some oral steroids.  Start with medications, core rehab, and progress from there following low back pain algorithm. No red flags are present.  I gave her a comprehensive home rehab program to work on for her back.  Will also  have her do some formal physical therapy.  Follow-up: If things do not improve in a month  Meds ordered this encounter  Medications   predniSONE (DELTASONE) 20 MG tablet    Sig: 2 tabs po daily for 5 days, then 1 tab po daily for 5 days    Dispense:  15 tablet    Refill:  0   Medications Discontinued During This Encounter  Medication Reason   predniSONE (DELTASONE) 20 MG  tablet Completed Course   Orders Placed This Encounter  Procedures   Ambulatory referral to Physical Therapy    Signed,  Nicole Defino T. Annie Saephan, MD   Outpatient Encounter Medications as of 07/29/2023  Medication Sig   acetaminophen (TYLENOL) 500 MG tablet Take 500 mg by mouth every 6 (six) hours as needed.   metFORMIN (GLUCOPHAGE) 500 MG tablet Take 500 mg by mouth 3 (three) times daily.   norethindrone (AYGESTIN) 5 MG tablet Take 5 mg by mouth daily.   predniSONE (DELTASONE) 20 MG tablet 2 tabs po daily for 5 days, then 1 tab po daily for 5 days   [DISCONTINUED] predniSONE (DELTASONE) 20 MG tablet 3 tabs by mouth daily x 3 days, then 2 tabs by mouth daily x 2 days then 1 tab by mouth daily x 2 days   No facility-administered encounter medications on file as of 07/29/2023.

## 2023-07-29 NOTE — Telephone Encounter (Signed)
Copied from CRM [id number]. Topic: Clinical - Medication Refill >> Jul 29, 2023  9:04 AM Drema Balzarine wrote: Most Recent Primary Care Visit:  Provider: Kerby Nora E  Department: [facility]  Visit Type: OFFICE VISIT  Date: 03/16/2023  Medication: A muscle relaxer  Has the patient contacted their pharmacy?  (Agent: If no, request that the patient contact the pharmacy for the refill. If patient does not wish to contact the pharmacy document the reason why and proceed with request.) (Agent: If yes, when and what did the pharmacy advise?)  Is this the correct pharmacy for this prescription?  If no, delete pharmacy and type the correct one.  This is the patient's preferred pharmacy:   [id number] [place], [place] - [address] [facility] [facility] [facility] [address] AT [address] [place] [address] Phone: [phone] Fax: [phone]   Has the prescription been filled recently? No  Is the patient out of the medication? Yes  Has the patient been seen for an appointment in the last year OR does the patient have an upcoming appointment? Yes  Can we respond through MyChart?   Agent: Please be advised that Rx refills may take up to 3 business days. We ask that you follow-up with your pharmacy.

## 2023-07-29 NOTE — Telephone Encounter (Signed)
Patient has appointment scheduled with Dr. Patsy Lager for 4 pm today. Recommend evaluation first.

## 2023-08-01 ENCOUNTER — Encounter: Payer: Self-pay | Admitting: Family Medicine

## 2023-08-02 ENCOUNTER — Ambulatory Visit: Payer: Self-pay | Admitting: Primary Care

## 2023-08-02 MED ORDER — HYDROCODONE-ACETAMINOPHEN 5-325 MG PO TABS: 1.0000 | ORAL_TABLET | Freq: Four times a day (QID) | ORAL | 0 refills | Status: AC | PRN

## 2023-08-02 NOTE — Telephone Encounter (Signed)
Autumn Stanley, thanks for sending to me.  I will address.  Autumn Stanley, please send calls and messages to me if I am seeing and managing the patient.    Thanks!

## 2023-08-02 NOTE — Telephone Encounter (Signed)
Copied from CRM [id number]. Topic: Clinical - Red Word Triage >> Aug 02, 2023  3:22 PM Denese Killings wrote: Red Word that prompted transfer to Nurse Triage: Patient is having a lot of nerve pain in her right leg. She states he was prescribed Prednisone from doctor and Ibuprofen/ Tylenol is not helping.  Chief Complaint: right leg pain: pt diagnosed with sciatica Symptoms: 8/10 right leg pain, right foot numbness: pt stated hurts so bad she had to crawl last night instead of walk Frequency: ongoing - last night 10/10 and today 8/10 Pertinent Negatives: Patient denies fever: swelling or streaking Disposition: [] ED /[] Urgent Care (no appt availability in office) / [] Appointment(In office/virtual)/ []  [facility]/ [] Home Care/ [x] Refused Recommended Disposition /[] [facility]/ []  Follow-up with PCP Additional Notes: pt would like to see if PCP could adjust/ add medication regimen to help with pain and numbness.  Nurse offered pt appt to come and pt stated unable to come in due to being seen in office on Friday: pt stated able to do virtual appt with PCP if needed: no virtual appts available at present time until Thursday: pt would like call back to advise of PCP advise/treatment options. Home care advise given  Reason for Disposition  [1] Caused by muscle cramps in the thigh, calf, or foot AND [2] present < 1 hour (brief, now gone)    Sciatica pain and numbness to right leg  [1] MODERATE pain (e.g., interferes with normal activities, limping) AND [2] present > 3 days  Answer Assessment - Initial Assessment Questions 1. ONSET: "When did the pain start?"      Office visit Friday for siatica 2. LOCATION: "Where is the pain located?"      Right leg 3. PAIN: "How bad is the pain?"    (Scale 1-10; or mild, moderate, severe)   -  MILD (1-3): doesn't interfere with normal activities    -  MODERATE (4-7): interferes with normal activities (e.g., work or school) or awakens from sleep,  limping    -  SEVERE (8-10): excruciating pain, unable to do any normal activities, unable to walk     8/10 when standing and walking around 4. WORK OR EXERCISE: "Has there been any recent work or exercise that involved this part of the body?"      N/a 5. CAUSE: "What do you think is causing the leg pain?"     sciatica 6. OTHER SYMPTOMS: "Do you have any other symptoms?" (e.g., chest pain, back pain, breathing difficulty, swelling, rash, fever, numbness, weakness)     Numbness, pain, nausea and feeling of warmth possibly due to pain 7. PREGNANCY: "Is there any chance you are pregnant?" "When was your last menstrual period?"     no  Protocols used: Leg Pain-A-AH

## 2023-08-04 NOTE — Progress Notes (Signed)
 Eliora Nienhuis T. Shannette Tabares, MD, CAQ Sports Medicine [facility] [address] [place], [address]  Phone: [phone]  FAX: [phone]  Autumn Stanley - 29 y.o. female  MRN [id number]  Date of Birth: Apr 12, 1996  Date: 08/05/2023  PCP: Gretta Comer POUR, NP  Referral: Gretta Comer POUR, NP  Chief Complaint  Patient presents with   Back Pain    Right Leg has been numb since Monday   Subjective:   Autumn Stanley is a 28 y.o. very pleasant female patient with Body mass index is 40.35 kg/m. who presents with the following:  Patient presents with some ongoing severe back pain.  I actually saw the patient last week, at that point did give her some steroids, and she has had some repetitive back pain and radiculopathy.  She has been having back pain for 6 months.  She contacted me over the weekend, and she has had some severe 10 out of 10 back pain that has been fairly crippling.  She has been unable to walk at times and required crawling on the ground.  Sunday and Monday, severe 10/10 pan and could not really walk.  Monday at work, could not sit and stood all day.  That night, eased up a little bit, but tried to sleep in bed, and awoke in two am and radiating down the R leg. Did not go to work on Tuesday - gotten a little bit better but up and down.  Whole R leg feels numb and tinging She is having radiating pain down the right leg No focal weakness  She is not having any bowel or bladder incontinence She has no history of prior spine surgery. No falls  Much less lateral foot Bottom of foot Posterior of lower leg Medial lower leg Thigh - all pinprick  Global, soft  Pred x 14 d   Review of Systems is noted in the HPI, as appropriate  Patient Active Problem List   Diagnosis Date Noted   COVID-19 05/06/2022   Acute right-sided low back pain with right-sided sciatica 02/05/2022   Dyslipidemia 01/30/2022   Elevated  prolactin level 01/30/2022   Obesity, Class III, BMI 40-49.9 (morbid obesity) (HCC) 01/30/2022   Anxiety and depression 05/26/2016   Irregular periods 05/26/2016   Frequent headaches 07/19/2015    Past Medical History:  Diagnosis Date   Frequent headaches    Kidney stone    Migraine     Past Surgical History:  Procedure Laterality Date   MOUTH SURGERY  2016    Family History  Problem Relation Age of Onset   Arthritis Mother    Breast cancer Mother    Arthritis Maternal Grandmother    Heart disease Maternal Grandmother    Hyperlipidemia Maternal Grandmother    Breast cancer Maternal Grandmother    Prostate cancer Maternal Grandfather    Hyperlipidemia Maternal Grandfather    Hyperlipidemia Paternal Grandmother    Hyperlipidemia Paternal Grandfather    Hypertension Father     Social History   Social History Narrative   Not on file     Objective:   BP 100/72 (BP Location: Right Arm, Patient Position: Sitting, Cuff Size: Large)   Pulse 84   Temp 98.2 F (36.8 C) (Temporal)   Ht 5' 9 (1.753 m)   Wt 273 lb 4 oz (123.9 kg)   LMP 07/14/2023   SpO2 97%   BMI 40.35 kg/m   GEN: No acute distress; alert,appropriate. PULM: Breathing  comfortably in no respiratory distress PSYCH: Normally interactive.    Range of motion at  the waist: Flexion, extension, lateral bending and rotation: Forward flexion to 50 degrees.  Limited extension, which causes pain.  Lateral bending and rotational maneuvers are more preserved.  No echymosis or edema Rises to examination table with moderate difficulty Gait: Moderate antalgic  Inspection/Deformity: N Paraspinus Tenderness: Diffuse, L3-S1 bilaterally worse on the right  B Ankle Dorsiflexion (L5,4): 5/5 B Great Toe Dorsiflexion (L5,4): 5/5 Heel Walk (L5): WNL Toe Walk (S1): WNL Rise/Squat (L4): WNL, moderate pain  SENSORY -To soft touch, the patient has global decreased sensation, and is difficult to differentiate between  portions of the leg, however notably decreased. To pinprick: The thigh is roughly decreased throughout Right lower extremity is decreased in the posterior aspect, and additionally the medial calf Dramatically decreased in the lateral foot Additionally dramatically decreased on the plantar surface of the foot  REFLEXES Knee (L4): 2+ Ankle (S1): 2+  B SLR, seated: Positive on the right B SLR, supine: Positive on the right B FABER: neg B Reverse FABER: neg B Greater Troch: NT B Log Roll: neg B Sciatic Notch: NT   Laboratory and Imaging Data: DG Lumbar Spine Complete Result Date: 08/05/2023 CLINICAL DATA:  Acute lumbar radiculopathy. Acute right-sided low back pain. Right leg numbness. EXAM: LUMBAR SPINE - COMPLETE 4+ VIEW COMPARISON:  None Available. FINDINGS: Five non-rib-bearing lumbar vertebra. Normal lumbar alignment. Normal vertebral body heights. No fracture or compression deformity. Suspect mild L5-S1 disc space narrowing, remaining disc spaces are preserved. Mild L5-S1 facet hypertrophy. No visible pars defects or focal bone abnormalities. Sacroiliac joints are normal. Evaluation is technically limited due to soft tissue attenuation from habitus. IMPRESSION: Mild L5-S1 disc space narrowing and facet hypertrophy. Electronically Signed   By: Andrea Gasman M.D.   On: 08/05/2023 16:55     Assessment and Plan:     ICD-10-CM   1. Acute right-sided low back pain with right-sided sciatica  M54.41 DG Lumbar Spine Complete    MR Lumbar Spine Wo Contrast    POCT urine pregnancy    CANCELED: POCT urine pregnancy    2. Right leg numbness  R20.0 DG Lumbar Spine Complete    MR Lumbar Spine Wo Contrast    3. Abnormal neurological exam  R29.90 MR Lumbar Spine Wo Contrast     The patient has marked escalation of pain, now up to 10 out of 10 level.  She is unable to lie down, and for extended periods of time over the last few days she was unable to get up from the floor, she had to crawl  for movement, she was unable to stand from a seated position, and she was markedly impaired throughout the entirety of multiple days.  Diffuse numbness throughout much of the right leg to soft touch and pinprick.  We will obtain an MRI of the lumbar spine without contrast to evaluate for acute cord compression, cord edema, or other urgent neurosurgical complications.  Additionally, she was previously referred to physical therapy, and she has failed multiple other conservative measures including steroids, NSAIDs, muscle relaxers, and I placed her on neuropathic pain medication today.  Medication Management during today's office visit: Meds ordered this encounter  Medications   predniSONE  (DELTASONE ) 20 MG tablet    Sig: 2 tabs po daily for 5 days, then 1 tab po daily for 5 days    Dispense:  15 tablet    Refill:  0   amitriptyline  (  ELAVIL ) 25 MG tablet    Sig: Take 1 tablet (25 mg total) by mouth at bedtime.    Dispense:  30 tablet    Refill:  3   Medications Discontinued During This Encounter  Medication Reason   predniSONE  (DELTASONE ) 20 MG tablet     Orders placed today for conditions managed today: Orders Placed This Encounter  Procedures   DG Lumbar Spine Complete   MR Lumbar Spine Wo Contrast   POCT urine pregnancy    Disposition: No follow-ups on file.  [employer] speech-to-text software was used for transcription in this dictation.  Possible transcriptional errors can occur using Animal nutritionist.   Signed,  Jacques DASEN. Francesa Eugenio, MD   Outpatient Encounter Medications as of 08/05/2023  Medication Sig   acetaminophen  (TYLENOL ) 500 MG tablet Take 500 mg by mouth every 6 (six) hours as needed.   amitriptyline  (ELAVIL ) 25 MG tablet Take 1 tablet (25 mg total) by mouth at bedtime.   HYDROcodone -acetaminophen  (NORCO/VICODIN) 5-325 MG tablet Take 1 tablet by mouth every 6 (six) hours as needed for moderate pain (pain score 4-6) or severe pain (pain score 7-10).    metFORMIN (GLUCOPHAGE) 500 MG tablet Take 500 mg by mouth 3 (three) times daily.   norethindrone (AYGESTIN) 5 MG tablet Take 5 mg by mouth daily.   predniSONE  (DELTASONE ) 20 MG tablet 2 tabs po daily for 5 days, then 1 tab po daily for 5 days   [DISCONTINUED] predniSONE  (DELTASONE ) 20 MG tablet 2 tabs po daily for 5 days, then 1 tab po daily for 5 days   No facility-administered encounter medications on file as of 08/05/2023.

## 2023-08-05 ENCOUNTER — Ambulatory Visit (INDEPENDENT_AMBULATORY_CARE_PROVIDER_SITE_OTHER): Payer: BC Managed Care – PPO | Admitting: Family Medicine

## 2023-08-05 ENCOUNTER — Ambulatory Visit (INDEPENDENT_AMBULATORY_CARE_PROVIDER_SITE_OTHER)
Admission: RE | Admit: 2023-08-05 | Discharge: 2023-08-05 | Disposition: A | Discharge status: Home / Self Care | Payer: BC Managed Care – PPO | Source: Ambulatory Visit | Attending: Family Medicine | Admitting: Family Medicine

## 2023-08-05 ENCOUNTER — Encounter: Payer: Self-pay | Admitting: Family Medicine

## 2023-08-05 ENCOUNTER — Other Ambulatory Visit: Payer: Self-pay | Admitting: Family Medicine

## 2023-08-05 VITALS — BP 100/72 | HR 84 | Temp 98.2°F | Ht 69.0 in | Wt 273.2 lb

## 2023-08-05 DIAGNOSIS — M5416 Radiculopathy, lumbar region: Secondary | ICD-10-CM | POA: Diagnosis not present

## 2023-08-05 DIAGNOSIS — M5441 Lumbago with sciatica, right side: Secondary | ICD-10-CM

## 2023-08-05 DIAGNOSIS — R200 Anesthesia of skin: Secondary | ICD-10-CM

## 2023-08-05 DIAGNOSIS — R2990 Unspecified symptoms and signs involving the nervous system: Secondary | ICD-10-CM

## 2023-08-05 DIAGNOSIS — M549 Dorsalgia, unspecified: Secondary | ICD-10-CM | POA: Diagnosis not present

## 2023-08-05 DIAGNOSIS — M4807 Spinal stenosis, lumbosacral region: Secondary | ICD-10-CM | POA: Diagnosis not present

## 2023-08-05 MED ORDER — PREDNISONE 20 MG PO TABS: ORAL_TABLET | ORAL | 0 refills | Status: AC

## 2023-08-05 MED ORDER — AMITRIPTYLINE HCL 25 MG PO TABS: 25.0000 mg | ORAL_TABLET | Freq: Every day | ORAL | 3 refills | Status: DC

## 2023-08-12 ENCOUNTER — Encounter: Payer: Self-pay | Admitting: Family Medicine

## 2023-08-19 DIAGNOSIS — N939 Abnormal uterine and vaginal bleeding, unspecified: Secondary | ICD-10-CM | POA: Diagnosis not present

## 2023-08-19 DIAGNOSIS — Z6841 Body Mass Index (BMI) 40.0 and over, adult: Secondary | ICD-10-CM | POA: Diagnosis not present

## 2023-08-19 DIAGNOSIS — Z3169 Encounter for other general counseling and advice on procreation: Secondary | ICD-10-CM | POA: Diagnosis not present

## 2023-08-19 DIAGNOSIS — R9389 Abnormal findings on diagnostic imaging of other specified body structures: Secondary | ICD-10-CM | POA: Diagnosis not present

## 2023-08-20 ENCOUNTER — Ambulatory Visit: Payer: BC Managed Care – PPO | Admitting: Physical Therapy

## 2023-08-20 ENCOUNTER — Ambulatory Visit
Admission: RE | Admit: 2023-08-20 | Discharge: 2023-08-20 | Disposition: A | Discharge status: Home / Self Care | Payer: BC Managed Care – PPO | Source: Ambulatory Visit | Attending: Family Medicine | Admitting: Family Medicine

## 2023-08-20 DIAGNOSIS — R2990 Unspecified symptoms and signs involving the nervous system: Secondary | ICD-10-CM

## 2023-08-20 DIAGNOSIS — R200 Anesthesia of skin: Secondary | ICD-10-CM

## 2023-08-20 DIAGNOSIS — M5441 Lumbago with sciatica, right side: Secondary | ICD-10-CM

## 2023-08-20 DIAGNOSIS — M5416 Radiculopathy, lumbar region: Secondary | ICD-10-CM | POA: Diagnosis not present

## 2023-08-21 ENCOUNTER — Encounter: Payer: Self-pay | Admitting: Family Medicine

## 2023-08-28 ENCOUNTER — Other Ambulatory Visit: Payer: Self-pay | Admitting: Family Medicine

## 2023-09-01 ENCOUNTER — Ambulatory Visit: Payer: BC Managed Care – PPO | Admitting: Physical Therapy

## 2023-09-10 DIAGNOSIS — N840 Polyp of corpus uteri: Secondary | ICD-10-CM | POA: Diagnosis not present

## 2023-09-10 DIAGNOSIS — Z3202 Encounter for pregnancy test, result negative: Secondary | ICD-10-CM | POA: Diagnosis not present

## 2023-09-24 ENCOUNTER — Ambulatory Visit: Payer: BC Managed Care – PPO | Admitting: Physical Therapy

## 2023-11-22 DIAGNOSIS — N840 Polyp of corpus uteri: Secondary | ICD-10-CM | POA: Diagnosis not present

## 2023-11-27 ENCOUNTER — Other Ambulatory Visit: Payer: Self-pay | Admitting: Family Medicine

## 2023-11-29 NOTE — Telephone Encounter (Signed)
 Last office visit 08/13/2023 with Dr. Geralyn Knee for Acute right-sided low back pain with right-sided sciatica  - Primary/Right leg numbness/Abnormal neurological exam Last refilled 08/30/23 for #90 with no refills.  Next Appt: No future appointments.

## 2023-12-17 DIAGNOSIS — E282 Polycystic ovarian syndrome: Secondary | ICD-10-CM | POA: Diagnosis not present

## 2023-12-17 DIAGNOSIS — N979 Female infertility, unspecified: Secondary | ICD-10-CM | POA: Diagnosis not present

## 2024-01-14 DIAGNOSIS — E282 Polycystic ovarian syndrome: Secondary | ICD-10-CM | POA: Diagnosis not present

## 2024-01-14 DIAGNOSIS — N979 Female infertility, unspecified: Secondary | ICD-10-CM | POA: Diagnosis not present

## 2024-01-18 DIAGNOSIS — Z3009 Encounter for other general counseling and advice on contraception: Secondary | ICD-10-CM | POA: Diagnosis not present

## 2024-02-15 DIAGNOSIS — Z3009 Encounter for other general counseling and advice on contraception: Secondary | ICD-10-CM | POA: Diagnosis not present

## 2024-04-07 DIAGNOSIS — N979 Female infertility, unspecified: Secondary | ICD-10-CM | POA: Diagnosis not present

## 2024-04-07 DIAGNOSIS — E282 Polycystic ovarian syndrome: Secondary | ICD-10-CM | POA: Diagnosis not present

## 2024-05-09 DIAGNOSIS — Z3149 Encounter for other procreative investigation and testing: Secondary | ICD-10-CM | POA: Diagnosis not present

## 2024-05-11 DIAGNOSIS — Z3183 Encounter for assisted reproductive fertility procedure cycle: Secondary | ICD-10-CM | POA: Diagnosis not present

## 2024-06-07 DIAGNOSIS — E282 Polycystic ovarian syndrome: Secondary | ICD-10-CM | POA: Diagnosis not present

## 2024-06-07 DIAGNOSIS — N979 Female infertility, unspecified: Secondary | ICD-10-CM | POA: Diagnosis not present

## 2024-06-09 DIAGNOSIS — Z3189 Encounter for other procreative management: Secondary | ICD-10-CM | POA: Diagnosis not present

## 2024-07-05 DIAGNOSIS — N979 Female infertility, unspecified: Secondary | ICD-10-CM | POA: Diagnosis not present

## 2024-07-05 DIAGNOSIS — Z3141 Encounter for fertility testing: Secondary | ICD-10-CM | POA: Diagnosis not present

## 2024-07-07 DIAGNOSIS — Z3189 Encounter for other procreative management: Secondary | ICD-10-CM | POA: Diagnosis not present
